# Patient Record
Sex: Female | Born: 1995 | Race: Black or African American | Hispanic: No | Smoking: Former smoker
Health system: Southern US, Community
[De-identification: ages and names within clinical notes are randomized; demographics above are authoritative.]

## PROBLEM LIST (undated history)

## (undated) DIAGNOSIS — F419 Anxiety disorder, unspecified: Secondary | ICD-10-CM

## (undated) DIAGNOSIS — J45909 Unspecified asthma, uncomplicated: Secondary | ICD-10-CM

## (undated) HISTORY — PX: NO PAST SURGERIES: SHX2092

---
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
## 2014-08-13 ENCOUNTER — Emergency Department (INDEPENDENT_AMBULATORY_CARE_PROVIDER_SITE_OTHER)
Admission: EM | Admit: 2014-08-13 | Discharge: 2014-08-13 | Disposition: A | Discharge status: Home / Self Care | Payer: Self-pay | Source: Home / Self Care | Attending: Family Medicine | Admitting: Family Medicine

## 2014-08-13 ENCOUNTER — Encounter (HOSPITAL_COMMUNITY): Payer: Self-pay | Admitting: Emergency Medicine

## 2014-08-13 DIAGNOSIS — N390 Urinary tract infection, site not specified: Secondary | ICD-10-CM

## 2014-08-13 HISTORY — DX: Anxiety disorder, unspecified: F41.9

## 2014-08-13 LAB — POCT URINALYSIS DIP (DEVICE)
Bilirubin Urine: NEGATIVE
Glucose, UA: NEGATIVE mg/dL
Hgb urine dipstick: NEGATIVE
Ketones, ur: NEGATIVE mg/dL
Nitrite: POSITIVE — AB
Protein, ur: NEGATIVE mg/dL
Specific Gravity, Urine: >=1.03 (ref 1.005–1.030)
Urobilinogen, UA: 1 mg/dL (ref 0.0–1.0)
pH: 6.5 (ref 5.0–8.0)

## 2014-08-13 LAB — POCT PREGNANCY, URINE: Preg Test, Ur: NEGATIVE

## 2014-08-13 MED ORDER — CIPROFLOXACIN HCL 500 MG PO TABS: 500.0000 mg | ORAL_TABLET | Freq: Two times a day (BID) | ORAL | Status: DC

## 2014-08-13 NOTE — ED Provider Notes (Signed)
Mary Green is a 18 y.o. female who presents to Urgent Care today for low back pain. Patient has a one-week history of mild intermittent bilateral low back pain. He does not radiate. No weakness or numbness bowel bladder dysfunction fevers or chills. No urinary for this he urgency or dysuria. No nausea vomiting or diarrhea. Patient additionally notes a very mild very intermittent chest pain history. The pain occurs for about one minute with smoking. No exertional component radiating pain palpitations or shortness of breath. No injury. No medications tried yet for either complaint.   Past Medical History  Diagnosis Date  . Anxiety    History  Substance Use Topics  . Smoking status: Current Every Day Smoker  . Smokeless tobacco: Not on file  . Alcohol Use: Yes   ROS as above Medications: No current facility-administered medications for this encounter.   Current Outpatient Prescriptions  Medication Sig Dispense Refill  . acetaminophen (TYLENOL) 325 MG tablet Take 650-975 mg by mouth daily as needed. For pain      . ciprofloxacin (CIPRO) 500 MG tablet Take 1 tablet (500 mg total) by mouth 2 (two) times daily.  14 tablet  0    Exam:  BP 127/80  Pulse 66  Temp(Src) 98.3 F (36.8 C) (Oral)  Resp 16  SpO2 99%  LMP 07/30/2014 Gen: Well NAD HEENT: EOMI,  MMM Lungs: Normal work of breathing. CTABL Heart: RRR no MRG Chest wall is nontender Abd: NABS, Soft. Nondistended, Nontender no CV angle tenderness to percussion Exts: Brisk capillary refill, warm and well perfused.  Back: Nontender. Normal back range of motion. Negative leg raise test and Faber test bilaterally. Reflexes and strength are equal bilaterally. Normal gait.  Results for orders placed during the hospital encounter of 08/13/14 (from the past 24 hour(s))  POCT URINALYSIS DIP (DEVICE)     Status: Abnormal   Collection Time    08/13/14  3:58 PM      Result Value Ref Range   Glucose, UA NEGATIVE  NEGATIVE mg/dL   Bilirubin Urine NEGATIVE  NEGATIVE   Ketones, ur NEGATIVE  NEGATIVE mg/dL   Specific Gravity, Urine >=1.030  1.005 - 1.030   Hgb urine dipstick NEGATIVE  NEGATIVE   pH 6.5  5.0 - 8.0   Protein, ur NEGATIVE  NEGATIVE mg/dL   Urobilinogen, UA 1.0  0.0 - 1.0 mg/dL   Nitrite POSITIVE (*) NEGATIVE   Leukocytes, UA SMALL (*) NEGATIVE  POCT PREGNANCY, URINE     Status: None   Collection Time    08/13/14  3:59 PM      Result Value Ref Range   Preg Test, Ur NEGATIVE  NEGATIVE   No results found.  Assessment and Plan: 18 y.o. female with urinary tract infection. Most likely explanation. Treat with Cipro as patient is allergic to penicillin. Culture pending. Advised patient to stop smoking. Followup with PCP.  Discussed warning signs or symptoms. Please see discharge instructions. Patient expresses understanding.     Rodolph BongEvan S Velna Hedgecock, MD 08/13/14 [phone]

## 2014-08-13 NOTE — ED Notes (Signed)
C/o lower back pain for one week.  Denies burning with urination. Bowel movement on arrival, no diarrhea Also c/o upper chest pain for 1 1/2 weeks.  Denies sob.  Denies uri

## 2014-08-13 NOTE — Discharge Instructions (Signed)
Thank you for coming in today. Call or go to the emergency room if you get worse, have trouble breathing, have chest pains, or palpitations.  Take Cipro twice daily for 1 week.  Use condoms while taking this medication.  Please stop smoking.  Come back as needed.   Urinary Tract Infection Urinary tract infections (UTIs) can develop anywhere along your urinary tract. Your urinary tract is your body's drainage system for removing wastes and extra water. Your urinary tract includes two kidneys, two ureters, a bladder, and a urethra. Your kidneys are a pair of bean-shaped organs. Each kidney is about the size of your fist. They are located below your ribs, one on each side of your spine. CAUSES Infections are caused by microbes, which are microscopic organisms, including fungi, viruses, and bacteria. These organisms are so small that they can only be seen through a microscope. Bacteria are the microbes that most commonly cause UTIs. SYMPTOMS  Symptoms of UTIs may vary by age and gender of the patient and by the location of the infection. Symptoms in young women typically include a frequent and intense urge to urinate and a painful, burning feeling in the bladder or urethra during urination. Older women and men are more likely to be tired, shaky, and weak and have muscle aches and abdominal pain. A fever may mean the infection is in your kidneys. Other symptoms of a kidney infection include pain in your back or sides below the ribs, nausea, and vomiting. DIAGNOSIS To diagnose a UTI, your caregiver will ask you about your symptoms. Your caregiver also will ask to provide a urine sample. The urine sample will be tested for bacteria and white blood cells. White blood cells are made by your body to help fight infection. TREATMENT  Typically, UTIs can be treated with medication. Because most UTIs are caused by a bacterial infection, they usually can be treated with the use of antibiotics. The choice of  antibiotic and length of treatment depend on your symptoms and the type of bacteria causing your infection. HOME CARE INSTRUCTIONS  If you were prescribed antibiotics, take them exactly as your caregiver instructs you. Finish the medication even if you feel better after you have only taken some of the medication.  Drink enough water and fluids to keep your urine clear or pale yellow.  Avoid caffeine, tea, and carbonated beverages. They tend to irritate your bladder.  Empty your bladder often. Avoid holding urine for long periods of time.  Empty your bladder before and after sexual intercourse.  After a bowel movement, women should cleanse from front to back. Use each tissue only once. SEEK MEDICAL CARE IF:   You have back pain.  You develop a fever.  Your symptoms do not begin to resolve within 3 days. SEEK IMMEDIATE MEDICAL CARE IF:   You have severe back pain or lower abdominal pain.  You develop chills.  You have nausea or vomiting.  You have continued burning or discomfort with urination. MAKE SURE YOU:   Understand these instructions.  Will watch your condition.  Will get help right away if you are not doing well or get worse. Document Released: 07/28/2005 Document Revised: 04/18/2012 Document Reviewed: 11/26/2011 [facility] CenterExitCare Patient Information 2015 [place], [place]. This information is not intended to replace advice given to you by your health care provider. Make sure you discuss any questions you have with your health care provider.

## 2014-08-16 LAB — URINE CULTURE
Colony Count: >=100000
Special Requests: NORMAL

## 2014-08-16 NOTE — ED Notes (Signed)
Urine culture: >100,000 colonies Klebsiella Pneumoniae.  Pt. adequately treated with Cipro. Vassie MoselleYork, Trayshawn Durkin M 08/16/2014

## 2014-08-20 ENCOUNTER — Emergency Department (HOSPITAL_COMMUNITY): Payer: Medicaid Other

## 2014-08-20 ENCOUNTER — Emergency Department (HOSPITAL_COMMUNITY)
Admission: EM | Admit: 2014-08-20 | Discharge: 2014-08-20 | Disposition: A | Discharge status: Home / Self Care | Payer: Medicaid Other | Attending: Emergency Medicine | Admitting: Emergency Medicine

## 2014-08-20 ENCOUNTER — Encounter (HOSPITAL_COMMUNITY): Payer: Self-pay | Admitting: Emergency Medicine

## 2014-08-20 DIAGNOSIS — Z87891 Personal history of nicotine dependence: Secondary | ICD-10-CM | POA: Insufficient documentation

## 2014-08-20 DIAGNOSIS — Z791 Long term (current) use of non-steroidal anti-inflammatories (NSAID): Secondary | ICD-10-CM | POA: Insufficient documentation

## 2014-08-20 DIAGNOSIS — R0789 Other chest pain: Secondary | ICD-10-CM

## 2014-08-20 DIAGNOSIS — R079 Chest pain, unspecified: Secondary | ICD-10-CM | POA: Diagnosis present

## 2014-08-20 DIAGNOSIS — Z8659 Personal history of other mental and behavioral disorders: Secondary | ICD-10-CM | POA: Diagnosis not present

## 2014-08-20 DIAGNOSIS — Z880 Allergy status to penicillin: Secondary | ICD-10-CM | POA: Insufficient documentation

## 2014-08-20 DIAGNOSIS — Z3202 Encounter for pregnancy test, result negative: Secondary | ICD-10-CM | POA: Insufficient documentation

## 2014-08-20 DIAGNOSIS — Z79899 Other long term (current) drug therapy: Secondary | ICD-10-CM | POA: Diagnosis not present

## 2014-08-20 LAB — BASIC METABOLIC PANEL
Anion gap: 13 (ref 5–15)
BUN: 10 mg/dL (ref 6–23)
CO2: 25 mEq/L (ref 19–32)
Calcium: 9.7 mg/dL (ref 8.4–10.5)
Chloride: 103 mEq/L (ref 96–112)
Creatinine, Ser: 0.93 mg/dL (ref 0.50–1.10)
GFR calc Af Amer: >90 mL/min (ref >90)
GFR calc non Af Amer: 89 mL/min — ABNORMAL LOW (ref >90)
Glucose, Bld: 96 mg/dL (ref 70–99)
Potassium: 3.6 mEq/L — ABNORMAL LOW (ref 3.7–5.3)
Sodium: 141 mEq/L (ref 137–147)

## 2014-08-20 LAB — CBC
HCT: 40.5 % (ref 36.0–46.0)
Hemoglobin: 13.7 g/dL (ref 12.0–15.0)
MCH: 29.5 pg (ref 26.0–34.0)
MCHC: 33.8 g/dL (ref 30.0–36.0)
MCV: 87.1 fL (ref 78.0–100.0)
Platelets: 326 10*3/uL (ref 150–400)
RBC: 4.65 MIL/uL (ref 3.87–5.11)
RDW: 12.3 % (ref 11.5–15.5)
WBC: 6.9 10*3/uL (ref 4.0–10.5)

## 2014-08-20 LAB — URINALYSIS, ROUTINE W REFLEX MICROSCOPIC
Bilirubin Urine: NEGATIVE
Glucose, UA: NEGATIVE mg/dL
Hgb urine dipstick: NEGATIVE
Ketones, ur: NEGATIVE mg/dL
Leukocytes, UA: NEGATIVE
Nitrite: NEGATIVE
Protein, ur: NEGATIVE mg/dL
Specific Gravity, Urine: 1.017 (ref 1.005–1.030)
Urobilinogen, UA: 0.2 mg/dL (ref 0.0–1.0)
pH: 6 (ref 5.0–8.0)

## 2014-08-20 LAB — I-STAT TROPONIN, ED: Troponin i, poc: 0.01 ng/mL (ref 0.00–0.08)

## 2014-08-20 LAB — POC URINE PREG, ED: Preg Test, Ur: NEGATIVE

## 2014-08-20 MED ORDER — NAPROXEN 250 MG PO TABS
500.0000 mg | ORAL_TABLET | Freq: Once | ORAL | Status: AC
  Administered 2014-08-20: 500 mg via ORAL
  Filled 2014-08-20: qty 2

## 2014-08-20 MED ORDER — NAPROXEN 500 MG PO TABS: 500.0000 mg | ORAL_TABLET | Freq: Two times a day (BID) | ORAL | Status: DC

## 2014-08-20 NOTE — ED Notes (Signed)
PA at bedside.

## 2014-08-20 NOTE — ED Provider Notes (Signed)
CSN: [id number]     Arrival date & time 08/20/14  0508 History   First MD Initiated Contact with Patient 08/20/14 0601     Chief Complaint  Patient presents with  . Chest Pain     (Consider location/radiation/quality/duration/timing/severity/associated sxs/prior Treatment) HPI Comments: Patient is a 18 year old female past medical history significant for anxiety presenting to the emergency department for 3 days of intermittent sharp pain to left side of her chest. 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 Aggravating factors: none. Medications tried prior to arrival: none. PERC negative.     Patient is a 18 y.o. female presenting with chest pain.  Chest Pain Associated symptoms: no abdominal pain, no cough, no fever, no nausea, no palpitations, no shortness of breath and not vomiting     Past Medical History  Diagnosis Date  . Anxiety    History reviewed. No pertinent past surgical history. No family history on file. History  Substance Use Topics  . Smoking status: Former Games developermoker  . Smokeless tobacco: Never Used  . Alcohol Use: Yes     Comment: weekly   OB History   Grav Para Term Preterm Abortions TAB SAB Ect Mult Living                 Review of Systems  Constitutional: Negative for fever and chills.  Respiratory: Negative for cough and shortness of breath.   Cardiovascular: Positive for chest pain. Negative for palpitations and leg swelling.  Gastrointestinal: Negative for nausea, vomiting and abdominal pain.  All other systems reviewed and are negative.     Allergies  Penicillins  Home Medications   Prior to Admission medications   Medication Sig Start Date End Date Taking? Authorizing Provider  acetaminophen (TYLENOL) 325 MG tablet Take 650-975 mg by mouth daily as needed for mild pain. For pain   Yes Historical Provider, MD  ciprofloxacin (CIPRO) 500 MG tablet Take 1 tablet (500 mg total)  by mouth 2 (two) times daily. 08/13/14  Yes Rodolph BongEvan S Corey, MD  naproxen (NAPROSYN) 500 MG tablet Take 1 tablet (500 mg total) by mouth 2 (two) times daily with a meal. 08/20/14   Kayela Humphres L Jalaine Riggenbach, PA-C   BP 108/61  Pulse 78  Temp(Src) 98 F (36.7 C) (Oral)  Resp 20  Ht 5\' 5"  (1.651 m)  Wt 131 lb (59.421 kg)  BMI 21.80 kg/m2  SpO2 100%  LMP 07/30/2014 Physical Exam  Nursing note and vitals reviewed. Constitutional: She is oriented to person, place, and time. She appears well-developed and well-nourished. No distress.  HENT:  Head: Normocephalic and atraumatic.  Right Ear: External ear normal.  Left Ear: External ear normal.  Nose: Nose normal.  Mouth/Throat: Oropharynx is clear and moist. No oropharyngeal exudate.  Eyes: Conjunctivae are normal.  Neck: Normal range of motion. Neck supple.  Cardiovascular: Normal rate, regular rhythm, normal heart sounds and intact distal pulses.   Pulmonary/Chest: Effort normal and breath sounds normal. No respiratory distress. She exhibits no tenderness.  Abdominal: Soft. Bowel sounds are normal. There is no tenderness.  Musculoskeletal: Normal range of motion. She exhibits no edema.  Neurological: She is alert and oriented to person, place, and time.  Skin: Skin is warm and dry. She is not diaphoretic.  Psychiatric: She has a normal mood and affect.    ED Course  Procedures (including critical care time) Medications  naproxen (NAPROSYN) tablet 500 mg (500 mg Oral Given  08/20/14 0609)    Labs Review Labs Reviewed  BASIC METABOLIC PANEL - Abnormal; Notable for the following:    Potassium 3.6 (*)    GFR calc non Af Amer 89 (*)    All other components within normal limits  CBC  URINALYSIS, ROUTINE W REFLEX MICROSCOPIC  I-STAT TROPOININ, ED  POC URINE PREG, ED    Imaging Review Dg Chest 2 View  08/20/2014   CLINICAL DATA:  LEFT-sided chest pain for a few days.  No Cough.  EXAM: CHEST  2 VIEW  COMPARISON:  None.  FINDINGS:  Cardiomediastinal silhouette is unremarkable. The lungs are clear without pleural effusions or focal consolidations. Trachea projects midline and there is no pneumothorax. Soft tissue planes and included osseous structures are non-suspicious.  IMPRESSION: No acute cardiopulmonary process.   Electronically Signed   By: Awilda Metroourtnay  Bloomer   On: 08/20/2014 06:34     EKG Interpretation   Date/Time:  Tuesday August 20 2014 05:19:12 EDT Ventricular Rate:  90 PR Interval:  159 QRS Duration: 76 QT Interval:  360 QTC Calculation: 440 R Axis:   60 Text Interpretation:  Sinus rhythm No significant change since last  tracing Confirmed by Erroll Lunani, Adeleke Ayokunle [phone]([id number]) on 08/20/2014 5:36:41  AM      MDM   Final diagnoses:  Chest wall pain    Filed Vitals:   08/20/14 0733  BP: 108/61  Pulse: 78  Temp:   Resp: 20   Afebrile, NAD, non-toxic appearing, AAOx4.   Patient is to be discharged with recommendation to follow up with PCP in regards to today's hospital visit. Chest pain is not likely of cardiac or pulmonary etiology d/t presentation, perc negative, VSS, no tracheal deviation, no JVD or new murmur, RRR, breath sounds equal bilaterally, EKG without acute abnormalities, negative troponin, and negative CXR. Pt has been advised to return to the ED is CP becomes exertional, associated with diaphoresis or nausea, radiates to left jaw/arm, worsens or becomes concerning in any way. Patient is stable at time of discharge  Case has been discussed with  Dr. Mora Bellmanni who agrees with the above plan to discharge.      Jeannetta EllisJennifer L Joel Mericle, PA-C 08/20/14 1608

## 2014-08-20 NOTE — ED Notes (Signed)
Pt arrives to ED c/o CP to the left side. 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 States that she has not taken anything for the pain.

## 2014-08-20 NOTE — ED Notes (Signed)
Took patient to the bathroom for urine sample. Pt forgot to urinate in the cup for collection. Asked her to please let us know as soon as she can go so we can get her tests going.

## 2014-08-20 NOTE — Discharge Instructions (Signed)
Please follow up with your primary care physician in 1-2 days. If you do not have one please call the [facility] number listed above. Please take medications as prescribed. Please read all discharge instructions and return precautions.    Chest Wall Pain Chest wall pain is pain in or around the bones and muscles of your chest. It may take up to 6 weeks to get better. It may take longer if you must stay physically active in your work and activities.  CAUSES  Chest wall pain may happen on its own. However, it may be caused by:  A viral illness like the flu.  Injury.  Coughing.  Exercise.  Arthritis.  Fibromyalgia.  Shingles. HOME CARE INSTRUCTIONS   Avoid overtiring physical activity. Try not to strain or perform activities that cause pain. This includes any activities using your chest or your abdominal and side muscles, especially if heavy weights are used.  Put ice on the sore area.  Put ice in a plastic bag.  Place a towel between your skin and the bag.  Leave the ice on for 15-20 minutes per hour while awake for the first 2 days.  Only take over-the-counter or prescription medicines for pain, discomfort, or fever as directed by your caregiver. SEEK IMMEDIATE MEDICAL CARE IF:   Your pain increases, or you are very uncomfortable.  You have a fever.  Your chest pain becomes worse.  You have new, unexplained symptoms.  You have nausea or vomiting.  You feel sweaty or lightheaded.  You have a cough with phlegm (sputum), or you cough up blood. MAKE SURE YOU:   Understand these instructions.  Will watch your condition.  Will get help right away if you are not doing well or get worse. Document Released: 10/18/2005 Document Revised: 01/10/2012 Document Reviewed: 06/14/2011 [facility] Patient Information 2015 [employer] [place]. This information is not intended to replace advice given to you by your health care provider. Make sure you discuss any  questions you have with your health care provider.

## 2014-08-21 NOTE — ED Provider Notes (Signed)
Medical screening examination/treatment/procedure(s) were performed by non-physician practitioner and as supervising physician I was immediately available for consultation/collaboration.   EKG Interpretation   Date/Time:  Tuesday August 20 2014 05:19:12 EDT Ventricular Rate:  90 PR Interval:  159 QRS Duration: 76 QT Interval:  360 QTC Calculation: 440 R Axis:   60 Text Interpretation:  Sinus rhythm No significant change since last  tracing Confirmed by Erroll Lunani, Chanse Kagel Ayokunle [phone]([id number]) on 08/20/2014 5:36:41  AM        Tomasita CrumbleAdeleke Dosha Broshears, MD 08/21/14 [phone]

## 2014-09-09 ENCOUNTER — Ambulatory Visit: Payer: Medicaid Other | Attending: Family Medicine | Admitting: Family Medicine

## 2014-09-09 ENCOUNTER — Other Ambulatory Visit (HOSPITAL_COMMUNITY)
Admission: RE | Admit: 2014-09-09 | Discharge: 2014-09-09 | Disposition: A | Discharge status: Home / Self Care | Payer: Medicaid Other | Source: Ambulatory Visit | Attending: Family Medicine | Admitting: Family Medicine

## 2014-09-09 ENCOUNTER — Encounter: Payer: Self-pay | Admitting: Family Medicine

## 2014-09-09 VITALS — BP 113/71 | HR 67 | Temp 98.2°F | Resp 16

## 2014-09-09 DIAGNOSIS — N912 Amenorrhea, unspecified: Secondary | ICD-10-CM | POA: Insufficient documentation

## 2014-09-09 DIAGNOSIS — F410 Panic disorder [episodic paroxysmal anxiety] without agoraphobia: Secondary | ICD-10-CM | POA: Insufficient documentation

## 2014-09-09 DIAGNOSIS — Z113 Encounter for screening for infections with a predominantly sexual mode of transmission: Secondary | ICD-10-CM | POA: Insufficient documentation

## 2014-09-09 DIAGNOSIS — N760 Acute vaginitis: Secondary | ICD-10-CM | POA: Diagnosis present

## 2014-09-09 DIAGNOSIS — N898 Other specified noninflammatory disorders of vagina: Secondary | ICD-10-CM

## 2014-09-09 DIAGNOSIS — L298 Other pruritus: Secondary | ICD-10-CM

## 2014-09-09 LAB — POCT URINE PREGNANCY: Preg Test, Ur: NEGATIVE

## 2014-09-09 MED ORDER — FLUCONAZOLE 150 MG PO TABS: 150.0000 mg | ORAL_TABLET | ORAL | Status: DC

## 2014-09-09 MED ORDER — FLUOXETINE HCL 20 MG PO TABS: 20.0000 mg | ORAL_TABLET | Freq: Every day | ORAL | Status: DC
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
## 2014-09-09 NOTE — Assessment & Plan Note (Signed)
Upreg negative.

## 2014-09-09 NOTE — Assessment & Plan Note (Signed)
A: exam consistent with yeast P: difucan 150 mg q 72 x 2 doses Counseled against douching F/u wet prep, GC and chlam

## 2014-09-09 NOTE — Patient Instructions (Addendum)
Ms. Mary Green,  Thank you for coming in today. It was a pleasure meeting you. I look forward to being your primary doctor.   1. Panic attacks: start prozac 20 mg daily in the morning. [facility].  2. Vaginal itching: It looks like yeast, take diflucan 150 mg today and again 3 days later.   U preg was negative.   F/u in 3- 4 weeks to f/u anxiety   Dr. Armen PickupFunches

## 2014-09-11 LAB — CERVICOVAGINAL ANCILLARY ONLY
Chlamydia: NEGATIVE
Neisseria Gonorrhea: NEGATIVE
Wet Prep (BD Affirm): NEGATIVE
Wet Prep (BD Affirm): NEGATIVE
Wet Prep (BD Affirm): POSITIVE — AB

## 2014-09-12 ENCOUNTER — Telehealth: Payer: Self-pay | Admitting: *Deleted

## 2014-09-12 NOTE — Telephone Encounter (Signed)
-----   Message from Lora PaulaJosalyn C Funches, MD sent at 09/12/2014  9:40 AM EST ----- Negative GC/chlam

## 2014-09-12 NOTE — Telephone Encounter (Signed)
Pt aware of lab result Advice to finish antibiotic as prescribe

## 2014-09-22 ENCOUNTER — Emergency Department (HOSPITAL_COMMUNITY)
Admission: EM | Admit: 2014-09-22 | Discharge: 2014-09-22 | Discharge status: Against Medical Advice | Payer: Medicaid Other | Attending: Emergency Medicine | Admitting: Emergency Medicine

## 2014-09-22 ENCOUNTER — Encounter (HOSPITAL_COMMUNITY): Payer: Self-pay | Admitting: Emergency Medicine

## 2014-09-22 DIAGNOSIS — M545 Low back pain: Secondary | ICD-10-CM | POA: Insufficient documentation

## 2014-09-22 DIAGNOSIS — Z3200 Encounter for pregnancy test, result unknown: Secondary | ICD-10-CM | POA: Diagnosis present

## 2014-09-22 DIAGNOSIS — R110 Nausea: Secondary | ICD-10-CM | POA: Diagnosis not present

## 2014-09-22 NOTE — ED Notes (Signed)
Once pt found out we would be doing a urine test, pt stated she done oone of those at home and wanted a blood test. Told pt about the process in ED and pt did not appear to like process. When pt went back out into lobby pt left and went down hall. Called for pt no pt in lobby.

## 2014-09-22 NOTE — ED Notes (Signed)
Pt states she had period for 2 days and that is not normal. Pt c/o lower back and abd pain. Pt states she has been nauseous and tired.

## 2014-09-28 ENCOUNTER — Emergency Department (HOSPITAL_COMMUNITY)
Admission: EM | Admit: 2014-09-28 | Discharge: 2014-09-28 | Disposition: A | Discharge status: Home / Self Care | Payer: Medicaid Other | Attending: Emergency Medicine | Admitting: Emergency Medicine

## 2014-09-28 ENCOUNTER — Encounter (HOSPITAL_COMMUNITY): Payer: Self-pay | Admitting: *Deleted

## 2014-09-28 DIAGNOSIS — R109 Unspecified abdominal pain: Secondary | ICD-10-CM | POA: Diagnosis present

## 2014-09-28 DIAGNOSIS — Z792 Long term (current) use of antibiotics: Secondary | ICD-10-CM | POA: Diagnosis not present

## 2014-09-28 DIAGNOSIS — F419 Anxiety disorder, unspecified: Secondary | ICD-10-CM | POA: Diagnosis not present

## 2014-09-28 DIAGNOSIS — Z880 Allergy status to penicillin: Secondary | ICD-10-CM | POA: Insufficient documentation

## 2014-09-28 DIAGNOSIS — Z87891 Personal history of nicotine dependence: Secondary | ICD-10-CM | POA: Insufficient documentation

## 2014-09-28 DIAGNOSIS — Z79899 Other long term (current) drug therapy: Secondary | ICD-10-CM | POA: Diagnosis not present

## 2014-09-28 LAB — URINALYSIS, ROUTINE W REFLEX MICROSCOPIC
Bilirubin Urine: NEGATIVE
Glucose, UA: NEGATIVE mg/dL
Hgb urine dipstick: NEGATIVE
Ketones, ur: NEGATIVE mg/dL
Leukocytes, UA: NEGATIVE
Nitrite: NEGATIVE
Protein, ur: NEGATIVE mg/dL
Specific Gravity, Urine: 1.022 (ref 1.005–1.030)
Urobilinogen, UA: 0.2 mg/dL (ref 0.0–1.0)
pH: 8 (ref 5.0–8.0)

## 2014-09-28 NOTE — ED Notes (Signed)
Pt sts "I have bladder infection, my boyfriend also has it his balls hurt. I lost my pill bottle my doctor gave me about a month ago and didn't finish taking them. My kidneys hurt again now so I know I have it again". Pt denies any urinary symptoms at this time.

## 2014-09-28 NOTE — ED Provider Notes (Signed)
CSN: [id number]     Arrival date & time 09/28/14  1145 History   First MD Initiated Contact with Patient 09/28/14 1236     Chief Complaint  Patient presents with  . Urinary Tract Infection      HPI Patient reports intermittent transient bilateral flank pain that last for a few seconds.  She is concerned that she could have urinary tract infection.  She's concerned because she did have a urinary tract infection and lost her antibiotics prior to completing them.  She also reports that her boyfriend is having testicular discomfort and this concerns her for possible UTI.  No fevers or chills.  Denies nausea vomiting.   Past Medical History  Diagnosis Date  . Anxiety 2009    previously on medications, seroquel, intuniv, risperdal, [facility]    Past Surgical History  Procedure Laterality Date  . Root canal  2008    Family History  Problem Relation Age of Onset  . Sarcoidosis Mother   . Carpal tunnel syndrome Mother   . Asthma Sister   . Heart murmur Sister   . Cancer Neg Hx    History  Substance Use Topics  . Smoking status: Former Smoker -- 0.25 packs/day for 2 years    Types: Cigarettes  . Smokeless tobacco: Never Used  . Alcohol Use: 0.6 oz/week    1 Shots of liquor per week     Comment: twice monthly    OB History    No data available     Review of Systems  All other systems reviewed and are negative.     Allergies  Penicillins  Home Medications   Prior to Admission medications   Medication Sig Start Date End Date Taking? Authorizing Provider  ciprofloxacin (CIPRO) 500 MG tablet Take 1 tablet (500 mg total) by mouth 2 (two) times daily. 08/13/14  Yes Rodolph BongEvan S Corey, MD  clindamycin (CLEOCIN) 150 MG capsule Take 150 mg by mouth every 8 (eight) hours. Take until all gone unknown days supply 09/05/14  Yes Historical Provider, MD  FLUoxetine (PROZAC) 20 MG tablet Take 1 tablet (20 mg total) by mouth daily. 09/09/14  Yes Josalyn C Funches, MD  ibuprofen  (ADVIL,MOTRIN) 200 MG tablet Take 400 mg by mouth every 6 (six) hours as needed for headache or moderate pain.   Yes Historical Provider, MD  fluconazole (DIFLUCAN) 150 MG tablet Take 1 tablet (150 mg total) by mouth every 3 (three) days. Patient not taking: Reported on 09/22/2014 09/09/14   Ellison CarwinJosalyn C Funches, MD   BP 138/78 mmHg  Pulse 85  Temp(Src) 98.3 F (36.8 C) (Oral)  Resp 14  SpO2 100%  LMP 09/12/2014 Physical Exam  Constitutional: She is oriented to person, place, and time. She appears well-developed and well-nourished. No distress.  HENT:  Head: Normocephalic and atraumatic.  Eyes: EOM are normal.  Neck: Normal range of motion.  Cardiovascular: Normal rate, regular rhythm and normal heart sounds.   Pulmonary/Chest: Effort normal and breath sounds normal.  Abdominal: Soft. She exhibits no distension. There is no tenderness.  Genitourinary:  No CVA tenderness  Musculoskeletal: Normal range of motion.  Neurological: She is alert and oriented to person, place, and time.  Skin: Skin is warm and dry.  Psychiatric: She has a normal mood and affect. Judgment normal.  Nursing note and vitals reviewed.   ED Course  Procedures (including critical care time) Labs Review Labs Reviewed  URINALYSIS, ROUTINE W REFLEX MICROSCOPIC  POC URINE PREG, ED  Imaging Review No results found.   EKG Interpretation None      MDM   Final diagnoses:  Flank pain    Well appearing.  Discharge home in good condition.  Urine and urine pregnancy test is negative.  No pain at this time.  Denies vaginal discharge or vaginal discomfort.    Lyanne CoKevin M Zeddie Njie, MD 09/28/14 [phone]

## 2014-10-21 ENCOUNTER — Encounter (HOSPITAL_COMMUNITY): Payer: Self-pay | Admitting: *Deleted

## 2014-10-21 ENCOUNTER — Emergency Department (HOSPITAL_COMMUNITY)
Admission: EM | Admit: 2014-10-21 | Discharge: 2014-10-21 | Disposition: A | Discharge status: Home / Self Care | Payer: Medicaid Other | Attending: Emergency Medicine | Admitting: Emergency Medicine

## 2014-10-21 DIAGNOSIS — Z87891 Personal history of nicotine dependence: Secondary | ICD-10-CM | POA: Diagnosis not present

## 2014-10-21 DIAGNOSIS — N390 Urinary tract infection, site not specified: Secondary | ICD-10-CM | POA: Diagnosis not present

## 2014-10-21 DIAGNOSIS — Z3200 Encounter for pregnancy test, result unknown: Secondary | ICD-10-CM | POA: Diagnosis present

## 2014-10-21 DIAGNOSIS — Z3202 Encounter for pregnancy test, result negative: Secondary | ICD-10-CM | POA: Insufficient documentation

## 2014-10-21 DIAGNOSIS — Z880 Allergy status to penicillin: Secondary | ICD-10-CM | POA: Insufficient documentation

## 2014-10-21 DIAGNOSIS — Z79899 Other long term (current) drug therapy: Secondary | ICD-10-CM | POA: Diagnosis not present

## 2014-10-21 DIAGNOSIS — F419 Anxiety disorder, unspecified: Secondary | ICD-10-CM | POA: Diagnosis not present

## 2014-10-21 LAB — URINALYSIS, ROUTINE W REFLEX MICROSCOPIC
Bilirubin Urine: NEGATIVE
Glucose, UA: NEGATIVE mg/dL
Hgb urine dipstick: NEGATIVE
Ketones, ur: NEGATIVE mg/dL
Nitrite: NEGATIVE
Protein, ur: NEGATIVE mg/dL
Specific Gravity, Urine: 1.027 (ref 1.005–1.030)
Urobilinogen, UA: 0.2 mg/dL (ref 0.0–1.0)
pH: 6.5 (ref 5.0–8.0)

## 2014-10-21 LAB — URINE MICROSCOPIC-ADD ON

## 2014-10-21 LAB — I-STAT BETA HCG BLOOD, ED (MC, WL, AP ONLY): I-stat hCG, quantitative: <5 m[IU]/mL (ref <5)

## 2014-10-21 LAB — PREGNANCY, URINE: Preg Test, Ur: NEGATIVE

## 2014-10-21 MED ORDER — CEPHALEXIN 500 MG PO CAPS: 500.0000 mg | ORAL_CAPSULE | Freq: Four times a day (QID) | ORAL | Status: DC

## 2014-10-21 NOTE — ED Notes (Addendum)
Pt reports she took pregnancy test 3 days ago, had positive result.pt brought pregnancy test with her.to rn home brought pregnancy test looked negative. Pt reported the positive line faded since the test was 3 days ago. Pt came to ED to get information and prenatal vitamins. No complaints at this time. Denies pain. Pt wants to know what she should eat.

## 2014-10-21 NOTE — Discharge Instructions (Signed)
Urinary Tract Infection Your pregnancy test is negative here. Follow up with your doctor. Return to the ED if you develop new or worsening symptoms. Urinary tract infections (UTIs) can develop anywhere along your urinary tract. Your urinary tract is your body's drainage system for removing wastes and extra water. Your urinary tract includes two kidneys, two ureters, a bladder, and a urethra. Your kidneys are a pair of bean-shaped organs. Each kidney is about the size of your fist. They are located below your ribs, one on each side of your spine. CAUSES Infections are caused by microbes, which are microscopic organisms, including fungi, viruses, and bacteria. These organisms are so small that they can only be seen through a microscope. Bacteria are the microbes that most commonly cause UTIs. SYMPTOMS  Symptoms of UTIs may vary by age and gender of the patient and by the location of the infection. Symptoms in young women typically include a frequent and intense urge to urinate and a painful, burning feeling in the bladder or urethra during urination. Older women and men are more likely to be tired, shaky, and weak and have muscle aches and abdominal pain. A fever may mean the infection is in your kidneys. Other symptoms of a kidney infection include pain in your back or sides below the ribs, nausea, and vomiting. DIAGNOSIS To diagnose a UTI, your caregiver will ask you about your symptoms. Your caregiver also will ask to provide a urine sample. The urine sample will be tested for bacteria and white blood cells. White blood cells are made by your body to help fight infection. TREATMENT  Typically, UTIs can be treated with medication. Because most UTIs are caused by a bacterial infection, they usually can be treated with the use of antibiotics. The choice of antibiotic and length of treatment depend on your symptoms and the type of bacteria causing your infection. HOME CARE INSTRUCTIONS  If you were  prescribed antibiotics, take them exactly as your caregiver instructs you. Finish the medication even if you feel better after you have only taken some of the medication.  Drink enough water and fluids to keep your urine clear or pale yellow.  Avoid caffeine, tea, and carbonated beverages. They tend to irritate your bladder.  Empty your bladder often. Avoid holding urine for long periods of time.  Empty your bladder before and after sexual intercourse.  After a bowel movement, women should cleanse from front to back. Use each tissue only once. SEEK MEDICAL CARE IF:   You have back pain.  You develop a fever.  Your symptoms do not begin to resolve within 3 days. SEEK IMMEDIATE MEDICAL CARE IF:   You have severe back pain or lower abdominal pain.  You develop chills.  You have nausea or vomiting.  You have continued burning or discomfort with urination. MAKE SURE YOU:   Understand these instructions.  Will watch your condition.  Will get help right away if you are not doing well or get worse. Document Released: 07/28/2005 Document Revised: 04/18/2012 Document Reviewed: 11/26/2011 [facility] Patient Information 2015 [employer] [employer] [place]. This information is not intended to replace advice given to you by your health care provider. Make sure you discuss any questions you have with your health care provider.

## 2014-10-21 NOTE — ED Provider Notes (Signed)
Understand the rationale CSN: [id number]     Arrival date & time 10/21/14  [id number] History   First MD Initiated Contact with Patient 10/21/14 [phone]     Chief Complaint  Patient presents with  . pregnancy information      (Consider location/radiation/quality/duration/timing/severity/associated sxs/prior Treatment) HPI Comments: Patient states she had a positive home pregnancy test 3 days ago and is concerned that she needs prenatal vitamins. She states her last menstrual cycle was in November. She denies any abdominal pain or vaginal bleeding. She denies any fevers or vomiting. She's had nausea and the mornings for the past month. Good by mouth intake and urine output. She's had whitish vaginal discharge for the past week but does not want that evaluated.  The history is provided by the patient.    Past Medical History  Diagnosis Date  . Anxiety 2009    previously on medications, seroquel, intuniv, risperdal, [facility]    Past Surgical History  Procedure Laterality Date  . Root canal  2008    Family History  Problem Relation Age of Onset  . Sarcoidosis Mother   . Carpal tunnel syndrome Mother   . Asthma Sister   . Heart murmur Sister   . Cancer Neg Hx    History  Substance Use Topics  . Smoking status: Former Smoker -- 0.25 packs/day for 2 years    Types: Cigarettes  . Smokeless tobacco: Never Used  . Alcohol Use: 0.6 oz/week    1 Shots of liquor per week     Comment: twice monthly    OB History    Gravida Para Term Preterm AB TAB SAB Ectopic Multiple Living   1              Review of Systems  Constitutional: Negative for fever, activity change and appetite change.  Respiratory: Negative for cough, chest tightness and shortness of breath.   Gastrointestinal: Positive for nausea. Negative for vomiting and abdominal pain.  Genitourinary: Negative for dysuria, hematuria, vaginal bleeding and vaginal discharge.  Musculoskeletal: Negative for myalgias, back pain and  arthralgias.  Skin: Negative for rash.  Neurological: Negative for dizziness, weakness and headaches.  A complete 10 system review of systems was obtained and all systems are negative except as noted in the HPI and PMH.      Allergies  Penicillins  Home Medications   Prior to Admission medications   Medication Sig Start Date End Date Taking? Authorizing Provider  FLUoxetine (PROZAC) 20 MG tablet Take 1 tablet (20 mg total) by mouth daily. 09/09/14  Yes Josalyn C Funches, MD  cephALEXin (KEFLEX) 500 MG capsule Take 1 capsule (500 mg total) by mouth 4 (four) times daily. 10/21/14   Glynn OctaveStephen Pau Banh, MD  clindamycin (CLEOCIN) 150 MG capsule Take 150 mg by mouth every 8 (eight) hours. Take until all gone unknown days supply 09/05/14   Historical Provider, MD  fluconazole (DIFLUCAN) 150 MG tablet Take 1 tablet (150 mg total) by mouth every 3 (three) days. Patient not taking: Reported on 09/22/2014 09/09/14   Lora PaulaJosalyn C Funches, MD  ibuprofen (ADVIL,MOTRIN) 200 MG tablet Take 400 mg by mouth every 6 (six) hours as needed for headache or moderate pain.    Historical Provider, MD   BP 147/93 mmHg  Pulse 94  Temp(Src) 98.3 F (36.8 C) (Oral)  Resp 16  SpO2 100%  LMP 09/12/2014 Physical Exam  Constitutional: She is oriented to person, place, and time. She appears well-developed and well-nourished. No distress.  HENT:  Head: Normocephalic and atraumatic.  Mouth/Throat: Oropharynx is clear and moist. No oropharyngeal exudate.  Eyes: Conjunctivae and EOM are normal. Pupils are equal, round, and reactive to light.  Neck: Normal range of motion. Neck supple.  No meningismus.  Cardiovascular: Normal rate, regular rhythm, normal heart sounds and intact distal pulses.   No murmur heard. Pulmonary/Chest: Effort normal and breath sounds normal. No respiratory distress.  Abdominal: Soft. There is no tenderness. There is no rebound and no guarding.  Musculoskeletal: Normal range of motion. She exhibits  no edema or tenderness.  No CVAT  Neurological: She is alert and oriented to person, place, and time. No cranial nerve deficit. She exhibits normal muscle tone. Coordination normal.  No ataxia on finger to nose bilaterally. No pronator drift. 5/5 strength throughout. CN 2-12 intact. Negative Romberg. Equal grip strength. Sensation intact. Gait is normal.   Skin: Skin is warm.  Psychiatric: She has a normal mood and affect. Her behavior is normal.  Nursing note and vitals reviewed.   ED Course  Procedures (including critical care time) Labs Review Labs Reviewed  URINALYSIS, ROUTINE W REFLEX MICROSCOPIC - Abnormal; Notable for the following:    APPearance CLOUDY (*)    Leukocytes, UA SMALL (*)    All other components within normal limits  URINE MICROSCOPIC-ADD ON - Abnormal; Notable for the following:    Squamous Epithelial / LPF FEW (*)    Bacteria, UA FEW (*)    All other components within normal limits  PREGNANCY, URINE  I-STAT BETA HCG BLOOD, ED ([facility], [facility], [facility] ONLY)    Imaging Review No results found.   EKG Interpretation None      MDM   Final diagnoses:  Urinary tract infection without hematuria, site unspecified   Patient concerned for pregnancy. Last menstrual cycle last month. No abdominal pain or vaginal bleeding  HCG negative. Urinalysis shows leukocytes and white blood cells.  Patient declines pelvic exam today.  Patient upset when told that her pregnancy test is negative. She states she's had multiple tests at home and doesn't understand why she is not pregnant here. She is insisting she needs a pregnancy blood test.  Serum hCG is negative. Patient reassured she is not pregnant. Heart rate improved to 69. She denies any suicidal or homicidal thoughts. She denies any hallucinations. She still has difficulty accepting her negative pregnancy test results.  PCP resources given.  Blood pressure 147/93, pulse 94, temperature 98.3 F (36.8 C), temperature source  Oral, resp. rate 16, last menstrual period 09/12/2014, SpO2 100 %.     Glynn OctaveStephen Norvell Ureste, MD 10/21/14 [phone]

## 2014-11-11 ENCOUNTER — Encounter (HOSPITAL_COMMUNITY): Payer: Self-pay

## 2014-11-11 ENCOUNTER — Inpatient Hospital Stay (HOSPITAL_COMMUNITY)
Admission: AD | Admit: 2014-11-11 | Discharge: 2014-11-11 | Disposition: A | Discharge status: Home / Self Care | Payer: Medicaid Other | Source: Ambulatory Visit | Attending: Family Medicine | Admitting: Family Medicine

## 2014-11-11 DIAGNOSIS — Z3202 Encounter for pregnancy test, result negative: Secondary | ICD-10-CM | POA: Diagnosis not present

## 2014-11-11 DIAGNOSIS — Z3200 Encounter for pregnancy test, result unknown: Secondary | ICD-10-CM | POA: Diagnosis present

## 2014-11-11 LAB — POCT PREGNANCY, URINE: Preg Test, Ur: NEGATIVE

## 2014-11-11 LAB — HCG, QUANTITATIVE, PREGNANCY: hCG, Beta Chain, Quant, S: <1 m[IU]/mL (ref <5)

## 2014-11-11 NOTE — MAU Provider Note (Signed)
Subjective:  Ms AVY BARLETT is a 19 y.o. female G1P0 who presents to MAU for a pregnancy test. When pregnancy test results were given the patient demanded a blood test due to movement she feels in her abdomen.  Beta hcg level ordered.   RN note:   Expand All Collapse All   Pt states had unprotected sex, then didn't have cycle. Has had positive upt's at home. Line is faint however is there. Denies bleeding or abnormal vaginal discharge.      Objective:  GENERAL: Well-developed, well-nourished female, anxious, angry  HEENT: Normocephalic, atraumatic.   LUNGS: Effort normal SKIN: Warm, dry and without erythema PSYCH: Normal mood and affect  Filed Vitals:   11/11/14 1234  BP: 127/68  Pulse: 69  Temp: 98.6 F (37 C)  Resp: 16   Results for orders placed or performed during the hospital encounter of 11/11/14 (from the past 48 hour(s))  Pregnancy, urine POC     Status: None   Collection Time: 11/11/14 12:37 PM  Result Value Ref Range   Preg Test, Ur NEGATIVE NEGATIVE    Comment:        THE SENSITIVITY OF THIS METHODOLOGY IS >24 mIU/mL   hCG, quantitative, pregnancy     Status: None   Collection Time: 11/11/14 12:47 PM  Result Value Ref Range   hCG, Beta Chain, Quant, S <1 <5 mIU/mL    Comment:          GEST. AGE      CONC.  (mIU/mL)   <=1 WEEK        5 - 50     2 WEEKS       50 - 500     3 WEEKS       100 - 10,000     4 WEEKS     1,000 - 30,000     5 WEEKS     3,500 - 115,000   6-8 WEEKS     12,000 - 270,000    12 WEEKS     15,000 - 220,000        FEMALE AND NON-PREGNANT FEMALE:     LESS THAN 5 mIU/mL REPEATED TO VERIFY     MDM  -Urine pregnancy test negative -Beta hcg level negative  -Patient and significant other brought back to room 8. I received permission to discuss lab results with patient with significant other present; patient was ok with me discussing. I informed the patient that her Hcg level was negative and she is not pregnant at this time. She then  stated that I needed to order and [place] because that is her right as a patient to have an US done today to determine why she was feeling movement in her abdomen. I told her that I did not have a reason to do an [place] in the emergency room at this time. I told her that I would be happy to assess her when a bed in the MAU became available and if I felt and [place] was necessary that I would order her to have one done out patient. The patient became very loud stating that it was her right as a patient to receive medical care. I again stated that I would be happy to see her and assess her when a bed was available. The patient began using foul language while walking out of MAU.   Assessment:  Negative urine pregnancy test Negative Hcg blood test    Plan:  Discharge home in  stable condition At home pregnancy test encouraged Follow up with PCP    Debbrah AlarJennifer Irene Mariaclara Spear, NP 11/11/2014 3:07 PM

## 2014-11-11 NOTE — MAU Note (Signed)
Pt states had unprotected sex, then didn't have cycle. Has had positive upt's at home. Line is faint however is there. Denies bleeding or abnormal vaginal discharge.

## 2015-01-06 ENCOUNTER — Emergency Department: Payer: Self-pay | Admitting: Emergency Medicine

## 2018-01-24 ENCOUNTER — Encounter: Payer: Self-pay | Admitting: *Deleted

## 2018-01-26 ENCOUNTER — Telehealth: Payer: Self-pay | Admitting: *Deleted

## 2018-01-26 NOTE — Telephone Encounter (Signed)
Reviewed records for chart prep for new ob visit next week. No ob records received. Called patient and asked her to either call her ob office in [place] to fax orders or if necessary come by our office as soon as possible to sign release for [place] to get her ob records. She states she will try.

## 2018-01-31 ENCOUNTER — Encounter: Payer: Self-pay | Admitting: Obstetrics and Gynecology

## 2018-01-31 ENCOUNTER — Ambulatory Visit (INDEPENDENT_AMBULATORY_CARE_PROVIDER_SITE_OTHER): Payer: Medicaid Other | Admitting: Obstetrics and Gynecology

## 2018-01-31 DIAGNOSIS — Z3402 Encounter for supervision of normal first pregnancy, second trimester: Secondary | ICD-10-CM

## 2018-01-31 DIAGNOSIS — Z3400 Encounter for supervision of normal first pregnancy, unspecified trimester: Secondary | ICD-10-CM | POA: Insufficient documentation

## 2018-01-31 MED ORDER — METOCLOPRAMIDE HCL 10 MG PO TABS: 10.0000 mg | ORAL_TABLET | Freq: Four times a day (QID) | ORAL | 1 refills | Status: DC | PRN

## 2018-01-31 NOTE — Progress Notes (Signed)
New OB Note  01/31/2018   CC:  Chief Complaint  Patient presents with  . Initial Prenatal Visit    Transfer of Care Patient: yes; moved from [place]  History of Present Illness: Mary Green is a 22 y.o. G1P0000 at [redacted]w[redacted]d by early ultrasound (in [place] per patient) being seen today for her first obstetrical visit. Her obstetrical history is significant for THC use in early pregnancy. This was a unplanned pregnancy. Patient does intend to breast feed. Patient plans to do contraception after completion of pregnancy but unsure of what type. Pregnancy history fully reviewed.  Her periods were: regular  She was using no method when she conceived.  She has Positive signs or symptoms of nausea/vomiting of pregnancy. She has Negative signs or symptoms of miscarriage or preterm labor  Patient reports no complaints.  ROS: A 12-point review of systems was performed and negative, except as stated in the above HPI.   OBGYN History: OB History  Gravida Para Term Preterm AB Living  1 0 0 0 0 0  SAB TAB Ectopic Multiple Live Births  0 0 0 0 0    # Outcome Date GA Lbr Len/2nd Weight Sex Delivery Anes PTL Lv  1 Current             GYN Hx: Unknown  Past Medical History: Past Medical History:  Diagnosis Date  . Anxiety 2009   previously on medications, seroquel, intuniv, risperdal, treated at [facility]     Past Surgical History: Past Surgical History:  Procedure Laterality Date  . NO PAST SURGERIES    . ROOT CANAL  2008     Family History:  Family History  Problem Relation Age of Onset  . Sarcoidosis Mother   . Carpal tunnel syndrome Mother   . Asthma Sister   . Heart murmur Sister   . Cancer Neg Hx     Social History:  Social History   Socioeconomic History  . Marital status: Single    Spouse name: Not on file  . Number of children: 0  . Years of education: 38   . Highest education level: Not on file  Occupational History  . Occupation: Consulting civil engineer    Comment: Part time    . Occupation: Occupational hygienist     Comment: Designer, television/film set   Social Needs  . Financial resource strain: Not on file  . Food insecurity:    Worry: Not on file    Inability: Not on file  . Transportation needs:    Medical: Not on file    Non-medical: Not on file  Tobacco Use  . Smoking status: Former Smoker    Packs/day: 0.25    Years: 2.00    Pack years: 0.50    Types: Cigarettes  . Smokeless tobacco: Never Used  Substance and Sexual Activity  . Alcohol use: Not Currently    Alcohol/week: 0.6 oz    Types: 1 Shots of liquor per week    Comment: twice monthly   . Drug use: No  . Sexual activity: Not Currently    Birth control/protection: Condom, None  Lifestyle  . Physical activity:    Days per week: Not on file    Minutes per session: Not on file  . Stress: Not on file  Relationships  . Social connections:    Talks on phone: Not on file    Gets together: Not on file    Attends religious service: Not on file  Active member of club or organization: Not on file    Attends meetings of clubs or organizations: Not on file    Relationship status: Not on file  . Intimate partner violence:    Fear of current or ex partner: Not on file    Emotionally abused: Not on file    Physically abused: Not on file    Forced sexual activity: Not on file  Other Topics Concern  . Not on file  Social History Narrative   Lives alone.    Mom lives in [place].     Allergy: Allergies  Allergen Reactions  . Penicillins Other (See Comments)    Childhood reaction    Current Outpatient Medications:  Current Outpatient Medications:  .  cephALEXin (KEFLEX) 500 MG capsule, Take 1 capsule (500 mg total) by mouth 4 (four) times daily., Disp: 40 capsule, Rfl: 0 .  clindamycin (CLEOCIN) 150 MG capsule, Take 150 mg by mouth every 8 (eight) hours. Take until all gone unknown days supply, Disp: , Rfl:  .  fluconazole (DIFLUCAN) 150 MG tablet, Take 1 tablet (150 mg total)  by mouth every 3 (three) days. (Patient not taking: Reported on 09/22/2014), Disp: 2 tablet, Rfl: 0 .  FLUoxetine (PROZAC) 20 MG tablet, Take 1 tablet (20 mg total) by mouth daily., Disp: 30 tablet, Rfl: 1 .  ibuprofen (ADVIL,MOTRIN) 200 MG tablet, Take 400 mg by mouth every 6 (six) hours as needed for headache or moderate pain., Disp: , Rfl:   Physical Exam:   BP 123/65   Pulse 92   Wt 64.8 kg (142 lb 12.8 oz)   LMP 09/12/2017   BMI 23.76 kg/m  Body mass index is 23.76 kg/m. Contractions: Not present Vag. Bleeding: None. Fundal height: 21 FHTs: 158  General appearance: Well nourished, well developed female in no acute distress.  Neck:  Supple, normal appearance, and no thyromegaly  Cardiovascular: regular rate and rhythm Respiratory: Normal respiratory effort Abdomen: No masses, hernias; diffusely non tender to palpation, non distended Breasts: not examined. Genitalia: deferred Neuro/Psych:  Normal mood and affect.  Skin:  Warm and dry.  Lymphatic:  No inguinal lymphadenopathy.    Assessment/Plan: G1P0000 3762w1d  1. Supervision of normal first pregnancy, antepartum Doing well. Records from piror office requested. Will hold off on initial labs until records reviewed. Rx for Reglan given due to n/v.   Continue prenatal vitamins. Ultrasound discussed; fetal anatomic survey: ordered. Problem list reviewed and updated. The nature of [name] with multiple MDs and other Advanced Practice Providers was explained to patient; also emphasized that residents, students are part of our team. DOES NOT WANT STUDENTS Routine obstetric precautions reviewed. Return in about 1 month (around 02/28/2018) for ob visit.    Mary AdaJazma Shalynn Jorstad, DO OB Fellow [facility]

## 2018-01-31 NOTE — Patient Instructions (Signed)
Second Trimester of Pregnancy The second trimester is from week 13 through week 28, month 4 through 6. This is often the time in pregnancy that you feel your best. Often times, morning sickness has lessened or quit. You may have more energy, and you may get hungry more often. Your unborn baby (fetus) is growing rapidly. At the end of the sixth month, he or she is about 9 inches long and weighs about 1 pounds. You will likely feel the baby move (quickening) between 18 and 20 weeks of pregnancy. Follow these instructions at home:  Avoid all smoking, herbs, and alcohol. Avoid drugs not approved by your doctor.  Do not use any tobacco products, including cigarettes, chewing tobacco, and electronic cigarettes. If you need help quitting, ask your doctor. You may get counseling or other support to help you quit.  Only take medicine as told by your doctor. Some medicines are safe and some are not during pregnancy.  Exercise only as told by your doctor. Stop exercising if you start having cramps.  Eat regular, healthy meals.  Wear a good support bra if your breasts are tender.  Do not use hot tubs, steam rooms, or saunas.  Wear your seat belt when driving.  Avoid raw meat, uncooked cheese, and liter boxes and soil used by cats.  Take your prenatal vitamins.  Take 1500-2000 milligrams of calcium daily starting at the 20th week of pregnancy until you deliver your baby.  Try taking medicine that helps you poop (stool softener) as needed, and if your doctor approves. Eat more fiber by eating fresh fruit, vegetables, and whole grains. Drink enough fluids to keep your pee (urine) clear or pale yellow.  Take warm water baths (sitz baths) to soothe pain or discomfort caused by hemorrhoids. Use hemorrhoid cream if your doctor approves.  If you have puffy, bulging veins (varicose veins), wear support hose. Raise (elevate) your feet for 15 minutes, 3-4 times a day. Limit salt in your diet.  Avoid heavy  lifting, wear low heals, and sit up straight.  Rest with your legs raised if you have leg cramps or low back pain.  Visit your dentist if you have not gone during your pregnancy. Use a soft toothbrush to brush your teeth. Be gentle when you floss.  You can have sex (intercourse) unless your doctor tells you not to.  Go to your doctor visits. Get help if:  You feel dizzy.  You have mild cramps or pressure in your lower belly (abdomen).  You have a nagging pain in your belly area.  You continue to feel sick to your stomach (nauseous), throw up (vomit), or have watery poop (diarrhea).  You have bad smelling fluid coming from your vagina.  You have pain with peeing (urination). Get help right away if:  You have a fever.  You are leaking fluid from your vagina.  You have spotting or bleeding from your vagina.  You have severe belly cramping or pain.  You lose or gain weight rapidly.  You have trouble catching your breath and have chest pain.  You notice sudden or extreme puffiness (swelling) of your face, hands, ankles, feet, or legs.  You have not felt the baby move in over an hour.  You have severe headaches that do not go away with medicine.  You have vision changes. This information is not intended to replace advice given to you by your health care provider. Make sure you discuss any questions you have with your health care   provider. Document Released: 01/12/2010 Document Revised: 03/25/2016 Document Reviewed: 12/19/2012 Elsevier Interactive Patient Education  2017 [employer]vier Inc.  

## 2018-02-03 ENCOUNTER — Encounter (HOSPITAL_COMMUNITY): Payer: Self-pay | Admitting: *Deleted

## 2018-02-09 ENCOUNTER — Ambulatory Visit (HOSPITAL_COMMUNITY)
Admission: RE | Admit: 2018-02-09 | Discharge: 2018-02-09 | Disposition: A | Discharge status: Home / Self Care | Payer: Medicaid Other | Source: Ambulatory Visit | Attending: Obstetrics and Gynecology | Admitting: Obstetrics and Gynecology

## 2018-02-09 ENCOUNTER — Other Ambulatory Visit: Payer: Self-pay | Admitting: Obstetrics and Gynecology

## 2018-02-09 DIAGNOSIS — Z3A21 21 weeks gestation of pregnancy: Secondary | ICD-10-CM | POA: Insufficient documentation

## 2018-02-09 DIAGNOSIS — Z3689 Encounter for other specified antenatal screening: Secondary | ICD-10-CM

## 2018-02-09 DIAGNOSIS — Z3400 Encounter for supervision of normal first pregnancy, unspecified trimester: Secondary | ICD-10-CM

## 2018-02-10 ENCOUNTER — Encounter: Payer: Self-pay | Admitting: Family Medicine

## 2018-02-10 DIAGNOSIS — Z3400 Encounter for supervision of normal first pregnancy, unspecified trimester: Secondary | ICD-10-CM

## 2018-02-10 NOTE — Progress Notes (Signed)
Chart reviewed.

## 2018-02-28 ENCOUNTER — Encounter: Payer: Self-pay | Admitting: Advanced Practice Midwife

## 2018-03-24 ENCOUNTER — Other Ambulatory Visit: Payer: Self-pay | Admitting: *Deleted

## 2018-03-24 DIAGNOSIS — Z3400 Encounter for supervision of normal first pregnancy, unspecified trimester: Secondary | ICD-10-CM

## 2018-03-28 ENCOUNTER — Other Ambulatory Visit: Payer: Self-pay

## 2018-03-28 ENCOUNTER — Encounter: Payer: Self-pay | Admitting: Obstetrics and Gynecology

## 2018-09-11 ENCOUNTER — Encounter (HOSPITAL_COMMUNITY): Payer: Self-pay

## 2019-12-21 ENCOUNTER — Encounter (HOSPITAL_COMMUNITY): Payer: Self-pay

## 2019-12-21 ENCOUNTER — Emergency Department (HOSPITAL_COMMUNITY): Payer: Medicaid Other

## 2019-12-21 ENCOUNTER — Other Ambulatory Visit: Payer: Self-pay

## 2019-12-21 ENCOUNTER — Emergency Department (HOSPITAL_COMMUNITY)
Admission: EM | Admit: 2019-12-21 | Discharge: 2019-12-21 | Disposition: A | Discharge status: Home / Self Care | Payer: Medicaid Other | Attending: Emergency Medicine | Admitting: Emergency Medicine

## 2019-12-21 DIAGNOSIS — Z87891 Personal history of nicotine dependence: Secondary | ICD-10-CM | POA: Insufficient documentation

## 2019-12-21 DIAGNOSIS — R519 Headache, unspecified: Secondary | ICD-10-CM | POA: Diagnosis not present

## 2019-12-21 LAB — I-STAT BETA HCG BLOOD, ED (MC, WL, AP ONLY): I-stat hCG, quantitative: <5 m[IU]/mL (ref <5)

## 2019-12-21 MED ORDER — PROCHLORPERAZINE EDISYLATE 10 MG/2ML IJ SOLN
10.0000 mg | Freq: Once | INTRAMUSCULAR | Status: AC
  Administered 2019-12-21: 12:00:00 10 mg via INTRAVENOUS
  Filled 2019-12-21: qty 2

## 2019-12-21 MED ORDER — DIPHENHYDRAMINE HCL 50 MG/ML IJ SOLN
12.5000 mg | Freq: Once | INTRAMUSCULAR | Status: AC
  Administered 2019-12-21: 12.5 mg via INTRAVENOUS
  Filled 2019-12-21: qty 1

## 2019-12-21 NOTE — Discharge Instructions (Signed)
You were seen in the emergency department today for a headache.  Your CT scan did not show any significant abnormalities.  Your pregnancy test was negative.  You were given a migraine cocktail with improvement in the ER.  Please take Motrin and/or Tylenol per over-the-counter dosing to help with any return of discomfort.  Please follow-up with your primary care provider within 3 days.  You do not have a primary care provider you can follow-up with the [place] [facility] or call the phone number in your discharge instructions.  Return to the ER for new or worsening symptoms including but not limited to worsening pain, inability to keep fluids down, change in your vision, numbness, weakness, dizziness, passing out, or any other concerns.

## 2019-12-21 NOTE — ED Triage Notes (Signed)
Patient c/o headache x 3 days. Patient denies any blurred vision and nausea.

## 2019-12-21 NOTE — ED Provider Notes (Signed)
[place] DEPT Provider Note   CSN: [id number] Arrival date & time: 12/21/19  1046     History Chief Complaint  Patient presents with  . Headache    Mary Green is a 24 y.o. female with a history of anxiety, panic attacks, & former tobacco use who presents to the ED with complaints of headache x 3 days. Patient states she has had intermittent headache to variable locations ( frontal, parietal, occipital) which has had gradual onset with each episode of pain.  Her current discomfort is a 5 out of 10 in severity.  She has tried Tylenol without much relief.  No specific alleviating or aggravating factors.  States that she has not had issues with headaches in the past or had similar headaches.  Denies visual disturbance, dizziness, numbness, weakness, neck stiffness, fever, chills, nausea, or vomiting.  She reports occasional alcohol use.  Denies drug use.  HPI     Past Medical History:  Diagnosis Date  . Anxiety 2009   previously on medications, seroquel, intuniv, risperdal, treated at [facility]     Patient Active Problem List   Diagnosis Date Noted  . Supervision of normal first pregnancy, antepartum 01/31/2018  . Amenorrhea 09/09/2014  . Panic attacks 09/09/2014  . Vaginal itching 09/09/2014    Past Surgical History:  Procedure Laterality Date  . NO PAST SURGERIES    . ROOT CANAL  2008      OB History    Gravida  1   Para  0   Term  0   Preterm  0   AB  0   Living  0     SAB  0   TAB  0   Ectopic  0   Multiple  0   Live Births  0           Family History  Problem Relation Age of Onset  . Sarcoidosis Mother   . Carpal tunnel syndrome Mother   . Asthma Sister   . Heart murmur Sister   . Cancer Neg Hx     Social History   Tobacco Use  . Smoking status: Former Smoker    Packs/day: 0.25    Years: 2.00    Pack years: 0.50    Types: Cigarettes  . Smokeless tobacco: Never Used  Substance Use Topics  .  Alcohol use: Yes    Alcohol/week: 1.0 standard drinks    Types: 1 Shots of liquor per week    Comment: twice monthly   . Drug use: No    Home Medications Prior to Admission medications   Medication Sig Start Date End Date Taking? Authorizing Provider  ibuprofen (ADVIL,MOTRIN) 200 MG tablet Take 400 mg by mouth every 6 (six) hours as needed for headache or moderate pain.   Yes [provider]  cephALEXin (KEFLEX) 500 MG capsule Take 1 capsule (500 mg total) by mouth 4 (four) times daily. Patient not taking: Reported on 12/21/2019 10/21/14   Ezequiel Essex, MD  fluconazole (DIFLUCAN) 150 MG tablet Take 1 tablet (150 mg total) by mouth every 3 (three) days. Patient not taking: Reported on 09/22/2014 09/09/14   Boykin Nearing, MD  FLUoxetine (PROZAC) 20 MG tablet Take 1 tablet (20 mg total) by mouth daily. Patient not taking: Reported on 12/21/2019 09/09/14   Boykin Nearing, MD  metoCLOPramide (REGLAN) 10 MG tablet Take 1 tablet (10 mg total) by mouth every 6 (six) hours as needed for nausea or vomiting. Patient not taking:  Reported on 12/21/2019 01/31/18   Pincus Large, DO    Allergies    Penicillins  Review of Systems   Review of Systems  Constitutional: Negative for chills and fever.  Eyes: Negative for visual disturbance.  Respiratory: Negative for shortness of breath.   Cardiovascular: Negative for chest pain.  Gastrointestinal: Negative for abdominal pain, nausea and vomiting.  Neurological: Positive for headaches. Negative for dizziness, seizures, syncope, facial asymmetry, speech difficulty, weakness, light-headedness and numbness.  All other systems reviewed and are negative.   Physical Exam Updated Vital Signs BP 119/85 (BP Location: Left Arm)   Pulse 93   Temp 98.7 F (37.1 C) (Oral)   Resp 18   Ht 5' 5.75" (1.67 m)   Wt 63.5 kg   LMP 11/20/2019 (Approximate)   SpO2 100%   BMI 22.77 kg/m   Physical Exam Vitals and nursing note reviewed.    Constitutional:      General: She is not in acute distress.    Appearance: Normal appearance. She is not toxic-appearing.  HENT:     Head: Normocephalic and atraumatic.     Mouth/Throat:     Pharynx: Oropharynx is clear. Uvula midline.  Eyes:     General: Vision grossly intact. Gaze aligned appropriately.     Extraocular Movements: Extraocular movements intact.     Conjunctiva/sclera: Conjunctivae normal.     Pupils: Pupils are equal, round, and reactive to light.     Comments: No proptosis.   Cardiovascular:     Rate and Rhythm: Normal rate and regular rhythm.  Pulmonary:     Effort: Pulmonary effort is normal.     Breath sounds: Normal breath sounds.  Abdominal:     General: There is no distension.     Palpations: Abdomen is soft.     Tenderness: There is no abdominal tenderness. There is no guarding or rebound.  Musculoskeletal:     Cervical back: Normal range of motion and neck supple. No rigidity.  Skin:    General: Skin is warm and dry.  Neurological:     Mental Status: She is alert.     Comments: Alert. Clear speech. No facial droop. CNIII-XII grossly intact. Bilateral upper and lower extremities' sensation grossly intact. 5/5 symmetric strength with grip strength and with plantar and dorsi flexion bilaterally . Normal finger to nose bilaterally. Negative pronator drift. Gait intact.    Psychiatric:        Mood and Affect: Mood normal.        Behavior: Behavior normal.     ED Results / Procedures / Treatments   Labs (all labs ordered are listed, but only abnormal results are displayed) Labs Reviewed  I-STAT BETA HCG BLOOD, ED ([facility], [facility], [facility] ONLY)    EKG None  Radiology CT Head Wo Contrast  Result Date: 12/21/2019 CLINICAL DATA:  Headache for 3 days EXAM: CT HEAD WITHOUT CONTRAST TECHNIQUE: Contiguous axial images were obtained from the base of the skull through the vertex without intravenous contrast. COMPARISON:  None. FINDINGS: Brain: No evidence of acute  infarction, hemorrhage, hydrocephalus, extra-axial collection or mass lesion/mass effect. Vascular: No hyperdense vessel or unexpected calcification. Skull: Normal. Negative for fracture or focal lesion. Sinuses/Orbits: No acute finding. Other: None. IMPRESSION: No acute intracranial pathology. Electronically Signed   By: Duanne Guess D.O.   On: 12/21/2019 12:45    Procedures Procedures (including critical care time)  Medications Ordered in ED Medications  prochlorperazine (COMPAZINE) injection 10 mg (10 mg Intravenous Given 12/21/19  1207)  diphenhydrAMINE (BENADRYL) injection 12.5 mg (12.5 mg Intravenous Given 12/21/19 1207)    ED Course  I have reviewed the triage vital signs and the nursing notes.  Pertinent labs & imaging results that were available during my care of the patient were reviewed by me and considered in my medical decision making (see chart for details).    MDM Rules/Calculators/A&P                      Patient presents with complaint of headache. Patient is nontoxic appearing with stable vital signs. Patient stated no history of similar headaches therefore head CT was obtained w/o acute process. Headaches have gradual onset with steady progression in severity, patient is afebrile with no focal neuro deficits, dizziness, change in vision, proptosis, or nuchal rigidity- overall non concerning for [facility], ICH, ischemic CVA, dural venous sinus thrombosis, acute glaucoma, giant cell arteritis, mass, or meningitis. Patient treated for headache with migraine cocktail with improvement, states she is ready to go home. I discussed treatment plan, need for PCP follow-up, and return precautions with the patient. Provided opportunity for questions, patient confirmed understanding and is in agreement with plan.   Final Clinical Impression(s) / ED Diagnoses Final diagnoses:  Acute nonintractable headache, unspecified headache type    Rx / DC Orders ED Discharge Orders    None         Cherly Anderson, PA-C 12/21/19 1318    Lorre Nick, MD 12/24/19 1214

## 2019-12-21 NOTE — ED Notes (Signed)
Patient stated she had to leave, she has a one year at home, she took her gallon of milk with her, escorted out. Did not want to wait on DC papers.

## 2020-02-25 ENCOUNTER — Other Ambulatory Visit: Payer: Self-pay

## 2020-02-25 ENCOUNTER — Emergency Department (HOSPITAL_BASED_OUTPATIENT_CLINIC_OR_DEPARTMENT_OTHER)
Admission: EM | Admit: 2020-02-25 | Discharge: 2020-02-25 | Disposition: A | Discharge status: Home / Self Care | Payer: Medicaid Other | Attending: Emergency Medicine | Admitting: Emergency Medicine

## 2020-02-25 ENCOUNTER — Emergency Department (HOSPITAL_BASED_OUTPATIENT_CLINIC_OR_DEPARTMENT_OTHER): Payer: Medicaid Other

## 2020-02-25 ENCOUNTER — Encounter (HOSPITAL_BASED_OUTPATIENT_CLINIC_OR_DEPARTMENT_OTHER): Payer: Self-pay | Admitting: Emergency Medicine

## 2020-02-25 DIAGNOSIS — M79602 Pain in left arm: Secondary | ICD-10-CM | POA: Insufficient documentation

## 2020-02-25 DIAGNOSIS — Z79899 Other long term (current) drug therapy: Secondary | ICD-10-CM | POA: Diagnosis not present

## 2020-02-25 DIAGNOSIS — Z880 Allergy status to penicillin: Secondary | ICD-10-CM | POA: Insufficient documentation

## 2020-02-25 DIAGNOSIS — F439 Reaction to severe stress, unspecified: Secondary | ICD-10-CM

## 2020-02-25 DIAGNOSIS — K0889 Other specified disorders of teeth and supporting structures: Secondary | ICD-10-CM | POA: Insufficient documentation

## 2020-02-25 DIAGNOSIS — N939 Abnormal uterine and vaginal bleeding, unspecified: Secondary | ICD-10-CM | POA: Diagnosis not present

## 2020-02-25 DIAGNOSIS — R0789 Other chest pain: Secondary | ICD-10-CM | POA: Insufficient documentation

## 2020-02-25 DIAGNOSIS — Z332 Encounter for elective termination of pregnancy: Secondary | ICD-10-CM

## 2020-02-25 LAB — CBC WITH DIFFERENTIAL/PLATELET
Abs Immature Granulocytes: 0.02 10*3/uL (ref 0.00–0.07)
Basophils Absolute: 0 10*3/uL (ref 0.0–0.1)
Basophils Relative: 1 %
Eosinophils Absolute: 0 10*3/uL (ref 0.0–0.5)
Eosinophils Relative: 0 %
HCT: 38 % (ref 36.0–46.0)
Hemoglobin: 13.1 g/dL (ref 12.0–15.0)
Immature Granulocytes: 0 %
Lymphocytes Relative: 28 %
Lymphs Abs: 1.9 10*3/uL (ref 0.7–4.0)
MCH: 31.7 pg (ref 26.0–34.0)
MCHC: 34.5 g/dL (ref 30.0–36.0)
MCV: 92 fL (ref 80.0–100.0)
Monocytes Absolute: 0.6 10*3/uL (ref 0.1–1.0)
Monocytes Relative: 8 %
Neutro Abs: 4.2 10*3/uL (ref 1.7–7.7)
Neutrophils Relative %: 63 %
Platelets: 426 10*3/uL — ABNORMAL HIGH (ref 150–400)
RBC: 4.13 MIL/uL (ref 3.87–5.11)
RDW: 11.7 % (ref 11.5–15.5)
WBC: 6.7 10*3/uL (ref 4.0–10.5)
nRBC: 0 % (ref 0.0–0.2)

## 2020-02-25 LAB — BASIC METABOLIC PANEL
Anion gap: 7 (ref 5–15)
BUN: 6 mg/dL (ref 6–20)
CO2: 23 mmol/L (ref 22–32)
Calcium: 8.9 mg/dL (ref 8.9–10.3)
Chloride: 109 mmol/L (ref 98–111)
Creatinine, Ser: 0.73 mg/dL (ref 0.44–1.00)
GFR calc Af Amer: >60 mL/min (ref >60)
GFR calc non Af Amer: >60 mL/min (ref >60)
Glucose, Bld: 107 mg/dL — ABNORMAL HIGH (ref 70–99)
Potassium: 3.8 mmol/L (ref 3.5–5.1)
Sodium: 139 mmol/L (ref 135–145)

## 2020-02-25 LAB — HCG, QUANTITATIVE, PREGNANCY: hCG, Beta Chain, Quant, S: 114 m[IU]/mL — ABNORMAL HIGH (ref <5)

## 2020-02-25 MED ORDER — NAPROXEN 500 MG PO TABS: 500.0000 mg | ORAL_TABLET | Freq: Two times a day (BID) | ORAL | 0 refills | Status: DC

## 2020-02-25 MED ORDER — KETOROLAC TROMETHAMINE 15 MG/ML IJ SOLN
15.0000 mg | Freq: Once | INTRAMUSCULAR | Status: DC
  Filled 2020-02-25: qty 1

## 2020-02-25 MED ORDER — CLINDAMYCIN HCL 150 MG PO CAPS: 150.0000 mg | ORAL_CAPSULE | Freq: Four times a day (QID) | ORAL | 0 refills | Status: DC

## 2020-02-25 NOTE — Discharge Instructions (Addendum)
You were seen today for multiple complaints including chest pain, dental pain, and concern for ongoing vaginal bleeding given your recent abortion.  Your chest pain work-up is reassuring.  Your EKG and chest x-ray are normal.  Take naproxen as needed for pain.  Regarding your dental pain, you will be given clindamycin to cover for dental infection.  Follow-up with your dentist as scheduled.  Regarding your elective abortion, your hemoglobin is normal.  Follow-up with your clinic for repeat beta hCG and ultrasound.

## 2020-02-25 NOTE — ED Triage Notes (Signed)
EMS transport from home. Pt reports left arm and left facial numbness x 3 days. She feels like it is related to her wisdom teeth. Also reports anxiety and family issues this evening which she states increased her Sx. She has her minor child with her due to having no childcare. She reported to EMS that she has been off her lexapro x 3 months and tonight she took a dose but it did not help. Also reports chest tightness (since she was a child per pt's report to EMS)

## 2020-02-25 NOTE — ED Notes (Signed)
Cab called for patient.
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
## 2020-02-25 NOTE — ED Notes (Signed)
ED Provider at bedside. 

## 2020-07-22 ENCOUNTER — Other Ambulatory Visit: Payer: Self-pay

## 2020-07-22 ENCOUNTER — Encounter (HOSPITAL_COMMUNITY): Payer: Self-pay

## 2020-07-22 ENCOUNTER — Emergency Department (HOSPITAL_COMMUNITY)
Admission: EM | Admit: 2020-07-22 | Discharge: 2020-07-22 | Disposition: A | Discharge status: Home / Self Care | Payer: Medicaid Other | Attending: Emergency Medicine | Admitting: Emergency Medicine

## 2020-07-22 ENCOUNTER — Ambulatory Visit (HOSPITAL_COMMUNITY)
Admission: EM | Admit: 2020-07-22 | Discharge: 2020-07-22 | Disposition: A | Discharge status: Home / Self Care | Payer: Medicaid Other | Attending: Family Medicine | Admitting: Family Medicine

## 2020-07-22 DIAGNOSIS — M542 Cervicalgia: Secondary | ICD-10-CM | POA: Diagnosis present

## 2020-07-22 DIAGNOSIS — Z5321 Procedure and treatment not carried out due to patient leaving prior to being seen by health care provider: Secondary | ICD-10-CM | POA: Diagnosis not present

## 2020-07-22 HISTORY — DX: Unspecified asthma, uncomplicated: J45.909

## 2020-07-22 LAB — HIV ANTIBODY (ROUTINE TESTING W REFLEX): HIV Screen 4th Generation wRfx: NONREACTIVE

## 2020-07-22 NOTE — ED Triage Notes (Signed)
Pt c/o enlarged areas around neckx2 days. Pt denies any other sx.

## 2020-07-22 NOTE — ED Triage Notes (Signed)
Pt presents to ED with complaints of neck pain that radiates around to front around throat x 2 days. Denies injury. Reports she works from home and uses computer frequently.

## 2020-07-23 NOTE — ED Provider Notes (Signed)
[facility] CARE CENTER   [id number] 07/22/20 Arrival Time: 1640  ASSESSMENT & PLAN:  1. Anterior neck pain     Unclear of exact etiology. Just noted yesterday. No abnormal PE findings. Reassured and encouraged close observation. Would like HIV testing; sent.    Follow-up Information    Norm Salt, [place].   Specialty: Physician Assistant Why: If worsening or failing to improve as anticipated. Contact information: [address] [place] [address] [phone]               Reviewed expectations re: course of current medical issues. Questions answered. Outlined signs and symptoms indicating need for more acute intervention. Patient verbalized understanding. After Visit Summary given.  SUBJECTIVE: History from: patient. Mary Green is a 24 y.o. female who reports feeling anterior neck discomfort. "Maybe felt like it was swollen". Noted yesterday. Afebrile. Normal swallowing and PO intake without n/v. No new foods. Breathing normally. No h/o similar. Would like HIV screening.   OBJECTIVE:  Vitals:   07/22/20 1724  BP: (!) 119/59  Pulse: 67  Resp: 16  Temp: 98.7 F (37.1 C)  TempSrc: Oral  SpO2: 100%  Weight: 74.4 kg  Height: 5' 4.75" (1.645 m)     GCS: 15 General appearance: alert; no distress HEENT: normocephalic; atraumatic; throat normal Neck: supple with FROM; no swelling or masses Lungs: clear to auscultation bilaterally; unlabored Skin: warm and dry Psychological: alert and cooperative; normal mood and affect    Labs Reviewed  HIV ANTIBODY (ROUTINE TESTING W REFLEX)    No results found.  Allergies  Allergen Reactions  . Penicillins Other (See Comments)    Childhood reaction   Past Medical History:  Diagnosis Date  . Anxiety 2009   previously on medications, seroquel, intuniv, risperdal, treated at [facility]   . Asthma    Past Surgical History:  Procedure Laterality Date  . NO PAST SURGERIES    . ROOT CANAL  2008     Family History  Problem Relation Age of Onset  . Sarcoidosis Mother   . Carpal tunnel syndrome Mother   . Asthma Sister   . Heart murmur Sister   . Cancer Neg Hx    Social History   Socioeconomic History  . Marital status: Legally Separated    Spouse name: Not on file  . Number of children: 0  . Years of education: 46   . Highest education level: Not on file  Occupational History  . Occupation: Consulting civil engineer    Comment: Part time   . Occupation: Occupational hygienist     CommentChief Technology Officer   Tobacco Use  . Smoking status: Former Smoker    Packs/day: 0.25    Years: 2.00    Pack years: 0.50    Types: Cigarettes  . Smokeless tobacco: Never Used  Vaping Use  . Vaping Use: Never used  Substance and Sexual Activity  . Alcohol use: Yes    Alcohol/week: 1.0 standard drink    Types: 1 Shots of liquor per week    Comment: twice monthly   . Drug use: No  . Sexual activity: Not Currently    Birth control/protection: Condom, None  Other Topics Concern  . Not on file  Social History Narrative   Lives alone.    Mom lives in [place].    Social Determinants of Health   Financial Resource Strain:   . Difficulty of Paying Living Expenses: Not on file  Food Insecurity:   . Worried About  Running Out of Food in the Last Year: Not on file  . Ran Out of Food in the Last Year: Not on file  Transportation Needs:   . Lack of Transportation (Medical): Not on file  . Lack of Transportation (Non-Medical): Not on file  Physical Activity:   . Days of Exercise per Week: Not on file  . Minutes of Exercise per Session: Not on file  Stress:   . Feeling of Stress : Not on file  Social Connections:   . Frequency of Communication with Friends and Family: Not on file  . Frequency of Social Gatherings with Friends and Family: Not on file  . Attends Religious Services: Not on file  . Active Member of Clubs or Organizations: Not on file  . Attends Banker  Meetings: Not on file  . Marital Status: Not on file          Mardella Layman, MD 07/23/20 1044

## 2020-07-29 ENCOUNTER — Emergency Department (HOSPITAL_COMMUNITY)
Admission: EM | Admit: 2020-07-29 | Discharge: 2020-07-30 | Disposition: A | Discharge status: Home / Self Care | Payer: Medicaid Other | Attending: Emergency Medicine | Admitting: Emergency Medicine

## 2020-07-29 ENCOUNTER — Ambulatory Visit (HOSPITAL_COMMUNITY)
Admission: EM | Admit: 2020-07-29 | Discharge: 2020-07-29 | Disposition: A | Discharge status: Home / Self Care | Payer: Medicaid Other

## 2020-07-29 ENCOUNTER — Other Ambulatory Visit: Payer: Self-pay

## 2020-07-29 ENCOUNTER — Encounter (HOSPITAL_COMMUNITY): Payer: Self-pay | Admitting: Emergency Medicine

## 2020-07-29 DIAGNOSIS — J45909 Unspecified asthma, uncomplicated: Secondary | ICD-10-CM | POA: Diagnosis not present

## 2020-07-29 DIAGNOSIS — Z87891 Personal history of nicotine dependence: Secondary | ICD-10-CM | POA: Insufficient documentation

## 2020-07-29 DIAGNOSIS — T7421XA Adult sexual abuse, confirmed, initial encounter: Secondary | ICD-10-CM | POA: Diagnosis not present

## 2020-07-29 DIAGNOSIS — Z206 Contact with and (suspected) exposure to human immunodeficiency virus [HIV]: Secondary | ICD-10-CM | POA: Diagnosis present

## 2020-07-29 MED ORDER — ELVITEG-COBIC-EMTRICIT-TENOFAF 150-150-200-10 MG PREPACK
5.0000 | ORAL_TABLET | Freq: Once | ORAL | Status: AC
  Administered 2020-07-30: 5 via ORAL
  Filled 2020-07-29: qty 1

## 2020-07-29 MED ORDER — ELVITEG-COBIC-EMTRICIT-TENOFAF 150-150-200-10 MG PO TABS: 1.0000 | ORAL_TABLET | Freq: Every day | ORAL | 0 refills | Status: DC

## 2020-07-29 NOTE — Discharge Instructions (Addendum)
Thank you for allowing me to care for you today in the Emergency Department.   Your prescriptions have been sent to your pharmacy.  I have also placed a consult to our social work team.  They should be reaching out to you tomorrow to address questions or concerns about cost of medications.  Take 1 tablet of Flagyl 2 times daily for the next 7 days.  Do not drink alcohol with this medication because it can cause vomiting.  Take 1 tablet of doxycycline 2 times daily for the next 7 days.  Take 1 tablet of Genvoya daily for the next 27 days.  You took a dose of this tonight in the emergency department.  It is important you complete the entire 28 days of this medication.  Because you were inadvertently given multiple tablets of the Genvoya, you may have some nausea or loose stools.  We discussed this with poison control and they did not have any other concerns.  Do not take your next tablet of Genvoya until tomorrow.  Please follow-up with the infectious disease clinic for repeat HIV test in approximately 6 weeks.  It is very important that you follow-up with your primary care doctor or the health department for further evaluation.  Return to the emergency department if you develop uncontrollable vomiting, if you feel unsafe at home, develop uncontrollable abdominal pain, or other new, concerning symptoms.  Follow-up Recommendations: - Check HIV Ab at 6 weeks, and a 4th generation Ag/Ab test or HIV RNA test at 3-4 months. If these tests are not available, standard antibody testing should be performed at 3 and 6 months. Symptoms of acute HIV should prompt immediate evaluation.
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
## 2020-07-30 ENCOUNTER — Telehealth: Payer: Self-pay | Admitting: Pharmacy Technician

## 2020-07-30 LAB — POC URINE PREG, ED: Preg Test, Ur: NEGATIVE

## 2020-07-30 MED ORDER — STERILE WATER FOR INJECTION IJ SOLN
INTRAMUSCULAR | Status: AC
  Filled 2020-07-30: qty 10

## 2020-07-30 MED ORDER — CEFTRIAXONE SODIUM 500 MG IJ SOLR
500.0000 mg | Freq: Once | INTRAMUSCULAR | Status: AC
  Administered 2020-07-30: 500 mg via INTRAMUSCULAR
  Filled 2020-07-30: qty 500

## 2020-07-30 MED ORDER — METRONIDAZOLE 500 MG PO TABS: 500.0000 mg | ORAL_TABLET | Freq: Two times a day (BID) | ORAL | 0 refills | Status: DC

## 2020-07-30 MED ORDER — DOXYCYCLINE HYCLATE 100 MG PO CAPS: 100.0000 mg | ORAL_CAPSULE | Freq: Two times a day (BID) | ORAL | 0 refills | Status: AC

## 2020-07-30 MED ORDER — ELVITEG-COBIC-EMTRICIT-TENOFAF 150-150-200-10 MG PO TABS: 1.0000 | ORAL_TABLET | Freq: Every day | ORAL | 0 refills | Status: DC
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
## 2020-08-08 ENCOUNTER — Ambulatory Visit (HOSPITAL_COMMUNITY)
Admission: EM | Admit: 2020-08-08 | Discharge: 2020-08-08 | Disposition: A | Discharge status: Home / Self Care | Payer: Medicaid Other | Attending: Family Medicine | Admitting: Family Medicine

## 2020-08-08 ENCOUNTER — Encounter (HOSPITAL_COMMUNITY): Payer: Self-pay | Admitting: Emergency Medicine

## 2020-08-08 ENCOUNTER — Other Ambulatory Visit: Payer: Self-pay

## 2020-08-08 DIAGNOSIS — M2550 Pain in unspecified joint: Secondary | ICD-10-CM

## 2020-08-08 DIAGNOSIS — R109 Unspecified abdominal pain: Secondary | ICD-10-CM

## 2020-08-08 LAB — CBC WITH DIFFERENTIAL/PLATELET
Abs Immature Granulocytes: 0.01 10*3/uL (ref 0.00–0.07)
Basophils Absolute: 0 10*3/uL (ref 0.0–0.1)
Basophils Relative: 1 %
Eosinophils Absolute: 0.1 10*3/uL (ref 0.0–0.5)
Eosinophils Relative: 1 %
HCT: 43.2 % (ref 36.0–46.0)
Hemoglobin: 14.2 g/dL (ref 12.0–15.0)
Immature Granulocytes: 0 %
Lymphocytes Relative: 36 %
Lymphs Abs: 2.3 10*3/uL (ref 0.7–4.0)
MCH: 30 pg (ref 26.0–34.0)
MCHC: 32.9 g/dL (ref 30.0–36.0)
MCV: 91.1 fL (ref 80.0–100.0)
Monocytes Absolute: 0.5 10*3/uL (ref 0.1–1.0)
Monocytes Relative: 8 %
Neutro Abs: 3.4 10*3/uL (ref 1.7–7.7)
Neutrophils Relative %: 54 %
Platelets: 319 10*3/uL (ref 150–400)
RBC: 4.74 MIL/uL (ref 3.87–5.11)
RDW: 12 % (ref 11.5–15.5)
WBC: 6.3 10*3/uL (ref 4.0–10.5)
nRBC: 0 % (ref 0.0–0.2)

## 2020-08-08 LAB — POCT URINALYSIS DIPSTICK, ED / UC
Bilirubin Urine: NEGATIVE
Glucose, UA: NEGATIVE mg/dL
Hgb urine dipstick: NEGATIVE
Ketones, ur: NEGATIVE mg/dL
Leukocytes,Ua: NEGATIVE
Nitrite: NEGATIVE
Protein, ur: NEGATIVE mg/dL
Specific Gravity, Urine: 1.02 (ref 1.005–1.030)
Urobilinogen, UA: 0.2 mg/dL (ref 0.0–1.0)
pH: 6 (ref 5.0–8.0)

## 2020-08-08 LAB — COMPREHENSIVE METABOLIC PANEL
ALT: 12 U/L (ref 0–44)
AST: 17 U/L (ref 15–41)
Albumin: 4.2 g/dL (ref 3.5–5.0)
Alkaline Phosphatase: 48 U/L (ref 38–126)
Anion gap: 11 (ref 5–15)
BUN: 9 mg/dL (ref 6–20)
CO2: 24 mmol/L (ref 22–32)
Calcium: 9.5 mg/dL (ref 8.9–10.3)
Chloride: 103 mmol/L (ref 98–111)
Creatinine, Ser: 0.82 mg/dL (ref 0.44–1.00)
GFR, Estimated: >60 mL/min (ref >60)
Glucose, Bld: 69 mg/dL — ABNORMAL LOW (ref 70–99)
Potassium: 3.6 mmol/L (ref 3.5–5.1)
Sodium: 138 mmol/L (ref 135–145)
Total Bilirubin: 0.1 mg/dL — ABNORMAL LOW (ref 0.3–1.2)
Total Protein: 7.3 g/dL (ref 6.5–8.1)
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
## 2020-08-08 NOTE — ED Triage Notes (Signed)
Pt presents with kidney, groin, and joint pain after starting Genvoya that was prescribed in the ED. Pt states she stopped the medication 3 days ago.

## 2020-08-16 ENCOUNTER — Encounter (HOSPITAL_COMMUNITY): Payer: Self-pay | Admitting: Emergency Medicine

## 2020-08-16 ENCOUNTER — Other Ambulatory Visit: Payer: Self-pay

## 2020-08-16 ENCOUNTER — Emergency Department (HOSPITAL_COMMUNITY)
Admission: EM | Admit: 2020-08-16 | Discharge: 2020-08-17 | Disposition: A | Discharge status: Home / Self Care | Payer: Medicaid Other | Attending: Emergency Medicine | Admitting: Emergency Medicine

## 2020-08-16 DIAGNOSIS — Z87891 Personal history of nicotine dependence: Secondary | ICD-10-CM | POA: Insufficient documentation

## 2020-08-16 DIAGNOSIS — R1012 Left upper quadrant pain: Secondary | ICD-10-CM | POA: Diagnosis not present

## 2020-08-16 DIAGNOSIS — J45909 Unspecified asthma, uncomplicated: Secondary | ICD-10-CM | POA: Insufficient documentation

## 2020-08-16 LAB — COMPREHENSIVE METABOLIC PANEL
ALT: 11 U/L (ref 0–44)
AST: 16 U/L (ref 15–41)
Albumin: 4.3 g/dL (ref 3.5–5.0)
Alkaline Phosphatase: 41 U/L (ref 38–126)
Anion gap: 10 (ref 5–15)
BUN: 9 mg/dL (ref 6–20)
CO2: 23 mmol/L (ref 22–32)
Calcium: 9.5 mg/dL (ref 8.9–10.3)
Chloride: 102 mmol/L (ref 98–111)
Creatinine, Ser: 0.85 mg/dL (ref 0.44–1.00)
GFR, Estimated: >60 mL/min (ref >60)
Glucose, Bld: 100 mg/dL — ABNORMAL HIGH (ref 70–99)
Potassium: 3.3 mmol/L — ABNORMAL LOW (ref 3.5–5.1)
Sodium: 135 mmol/L (ref 135–145)
Total Bilirubin: 0.5 mg/dL (ref 0.3–1.2)
Total Protein: 7.2 g/dL (ref 6.5–8.1)

## 2020-08-16 LAB — CBC
HCT: 41.5 % (ref 36.0–46.0)
Hemoglobin: 13.6 g/dL (ref 12.0–15.0)
MCH: 29.4 pg (ref 26.0–34.0)
MCHC: 32.8 g/dL (ref 30.0–36.0)
MCV: 89.8 fL (ref 80.0–100.0)
Platelets: 324 10*3/uL (ref 150–400)
RBC: 4.62 MIL/uL (ref 3.87–5.11)
RDW: 12 % (ref 11.5–15.5)
WBC: 6.4 10*3/uL (ref 4.0–10.5)
nRBC: 0 % (ref 0.0–0.2)

## 2020-08-16 LAB — URINALYSIS, ROUTINE W REFLEX MICROSCOPIC
Bilirubin Urine: NEGATIVE
Glucose, UA: NEGATIVE mg/dL
Hgb urine dipstick: NEGATIVE
Ketones, ur: NEGATIVE mg/dL
Leukocytes,Ua: NEGATIVE
Nitrite: NEGATIVE
Protein, ur: NEGATIVE mg/dL
Specific Gravity, Urine: 1.023 (ref 1.005–1.030)
pH: 5 (ref 5.0–8.0)

## 2020-08-16 LAB — I-STAT BETA HCG BLOOD, ED (MC, WL, AP ONLY): I-stat hCG, quantitative: <5 m[IU]/mL (ref <5)

## 2020-08-16 LAB — LIPASE, BLOOD: Lipase: 27 U/L (ref 11–51)

## 2020-08-16 NOTE — ED Triage Notes (Signed)
Patient reports LUQ pain for 2 days , denies emesis or diarrhea , no fever or chills .

## 2020-08-17 MED ORDER — HYDROXYZINE HCL 25 MG PO TABS: 25.0000 mg | ORAL_TABLET | Freq: Four times a day (QID) | ORAL | 0 refills | Status: DC

## 2020-08-17 MED ORDER — FAMOTIDINE 20 MG PO TABS: 20.0000 mg | ORAL_TABLET | Freq: Two times a day (BID) | ORAL | 0 refills | Status: DC
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
## 2020-08-17 NOTE — Discharge Instructions (Addendum)
Please take hydroxyzine for anxiety.  You may use it during the day every 6 hours I recommend doing this first to see how drowsy it makes you feel.  You may also use it at nighttime.  It may cause some slight dry mouth but no other significant symptoms.  I also recommend taking famotidine 20 mg once in the morning once in the evening.

## 2020-09-03 ENCOUNTER — Ambulatory Visit (HOSPITAL_COMMUNITY)
Admission: EM | Admit: 2020-09-03 | Discharge: 2020-09-03 | Disposition: A | Discharge status: Home / Self Care | Payer: Medicaid Other | Attending: Family Medicine | Admitting: Family Medicine

## 2020-09-03 ENCOUNTER — Encounter (HOSPITAL_COMMUNITY): Payer: Self-pay | Admitting: Emergency Medicine

## 2020-09-03 ENCOUNTER — Ambulatory Visit (INDEPENDENT_AMBULATORY_CARE_PROVIDER_SITE_OTHER): Payer: Medicaid Other

## 2020-09-03 ENCOUNTER — Other Ambulatory Visit: Payer: Self-pay

## 2020-09-03 DIAGNOSIS — R079 Chest pain, unspecified: Secondary | ICD-10-CM

## 2020-09-03 DIAGNOSIS — F17200 Nicotine dependence, unspecified, uncomplicated: Secondary | ICD-10-CM | POA: Diagnosis not present

## 2020-09-03 DIAGNOSIS — R252 Cramp and spasm: Secondary | ICD-10-CM | POA: Insufficient documentation

## 2020-09-03 LAB — CBC
HCT: 46.3 % — ABNORMAL HIGH (ref 36.0–46.0)
Hemoglobin: 15.4 g/dL — ABNORMAL HIGH (ref 12.0–15.0)
MCH: 29.7 pg (ref 26.0–34.0)
MCHC: 33.3 g/dL (ref 30.0–36.0)
MCV: 89.4 fL (ref 80.0–100.0)
Platelets: 435 10*3/uL — ABNORMAL HIGH (ref 150–400)
RBC: 5.18 MIL/uL — ABNORMAL HIGH (ref 3.87–5.11)
RDW: 11.9 % (ref 11.5–15.5)
WBC: 5.2 10*3/uL (ref 4.0–10.5)
nRBC: 0 % (ref 0.0–0.2)

## 2020-09-03 LAB — COMPREHENSIVE METABOLIC PANEL
ALT: 13 U/L (ref 0–44)
AST: 18 U/L (ref 15–41)
Albumin: 4.5 g/dL (ref 3.5–5.0)
Alkaline Phosphatase: 60 U/L (ref 38–126)
Anion gap: 10 (ref 5–15)
BUN: 6 mg/dL (ref 6–20)
CO2: 26 mmol/L (ref 22–32)
Calcium: 10 mg/dL (ref 8.9–10.3)
Chloride: 102 mmol/L (ref 98–111)
Creatinine, Ser: 0.84 mg/dL (ref 0.44–1.00)
GFR, Estimated: >60 mL/min (ref >60)
Glucose, Bld: 94 mg/dL (ref 70–99)
Potassium: 4 mmol/L (ref 3.5–5.1)
Sodium: 138 mmol/L (ref 135–145)
Total Bilirubin: 0.8 mg/dL (ref 0.3–1.2)
Total Protein: 7.8 g/dL (ref 6.5–8.1)

## 2020-09-03 LAB — TSH: TSH: 3.597 u[IU]/mL (ref 0.350–4.500)

## 2020-09-03 NOTE — Discharge Instructions (Addendum)
You have been seen at the [facility] today for chest pain. Your evaluation today was not suggestive of any emergent condition requiring medical intervention at this time. Your ECG (heart tracing) did not show any worrisome changes. However, some medical problems make take more time to appear. Therefore, it's very important that you pay attention to any new symptoms or worsening of your current condition.  Please proceed directly to the Emergency Department immediately should you feel worse in any way or have any of the following symptoms: increasing or different chest pain, pain that spreads to your arm, neck, jaw, back or abdomen, shortness of breath, or nausea and vomiting.  

## 2020-09-03 NOTE — ED Triage Notes (Signed)
Pain in both lower legs and numbness below knees.  Left chest pain started yesterday.  Pain is sharp and dull. Pain in left forearm.  Legs 3 days of discomfort, chest 2 days and left forearm today.  Patient has called EMS and they told her she was fine

## 2020-09-06 NOTE — ED Provider Notes (Signed)
[facility]   [id number] 09/03/20 Arrival Time: 1404  ASSESSMENT & PLAN:  1. Chest pain, unspecified type   2. Leg cramping     Patient history and exam consistent with non-cardiac cause of chest pain. Conservative measures indicated. OTC analgesics as needed.  ECG: Performed today and interpreted by me: Normal sinus rhythm Normal ECG No significant change since last tracing  I have personally viewed the imaging studies ordered this visit. Normal CXR. No signs of infection. No pneumothorax.    Discharge Instructions     You have been seen at the [facility] today for chest pain. Your evaluation today was not suggestive of any emergent condition requiring medical intervention at this time. Your ECG (heart tracing) did not show any worrisome changes. However, some medical problems make take more time to appear. Therefore, it's very important that you pay attention to any new symptoms or worsening of your current condition.  Please proceed directly to the Emergency Department immediately should you feel worse in any way or have any of the following symptoms: increasing or different chest pain, pain that spreads to your arm, neck, jaw, back or abdomen, shortness of breath, or nausea and vomiting.      Chest pain precautions given. Reviewed expectations re: course of current medical issues. Questions answered. Outlined signs and symptoms indicating need for more acute intervention. Patient verbalized understanding. After Visit Summary given.   SUBJECTIVE:  History from: patient. Mary Green is a 24 y.o. female who presents with complaint of intermittent chest pain without SOB and "tingling in my legs". First noted yesterday. Chest pain described as alternating between sharp and dull. Called EMS to her house. "Told my I was fine but I want to make sure I'm not gonna die." Ambulatory without difficulty. No recent illnesses. Afebrile. No abdominal or  back pain. No injury/trauama.  Denies: fatigue, irregular heart beat, near-syncope, orthopnea, palpitations and paroxysmal nocturnal dyspnea. Aggravating factors: have not been identified. Alleviating factors: have not been identified. Self/OTC treatment: none. History of similar: reports h/o chest pains. Illicit drug use: denied.  Social History   Tobacco Use  Smoking Status Former Smoker  . Packs/day: 0.25  . Years: 2.00  . Pack years: 0.50  . Types: Cigarettes  Smokeless Tobacco Never Used   Social History   Substance and Sexual Activity  Alcohol Use Yes  . Alcohol/week: 1.0 standard drink  . Types: 1 Shots of liquor per week   Comment: twice monthly       OBJECTIVE:  Vitals:   09/03/20 1506  BP: 121/79  Pulse: 92  Resp: 20  Temp: 98.1 F (36.7 C)  TempSrc: Oral  SpO2: 100%    General appearance: alert, oriented, no acute distress Eyes: PERRLA; EOMI; conjunctivae normal HENT: normocephalic; atraumatic Neck: supple with FROM Lungs: without labored respirations; speaks full sentences without difficulty; CTAB Heart: regular rate and rhythm without murmer Chest Wall: without tenderness to palpation Abdomen: soft, non-tender; no guarding or rebound tenderness Extremities: without edema; without calf swelling or tenderness; symmetrical without gross deformities Skin: warm and dry; without rash or lesions Neuro: normal gait Psychological: alert and cooperative; normal mood and affect  Labs: Results for orders placed or performed during the hospital encounter of 09/03/20  CBC  Result Value Ref Range   WBC 5.2 4.0 - 10.5 K/uL   RBC 5.18 (H) 3.87 - 5.11 MIL/uL   Hemoglobin 15.4 (H) 12.0 - 15.0 g/dL   HCT 61.4 (H) 36 -  46 %   MCV 89.4 80.0 - 100.0 fL   MCH 29.7 26.0 - 34.0 pg   MCHC 33.3 30.0 - 36.0 g/dL   RDW 08.1 44.8 - 18.5 %   Platelets 435 (H) 150 - 400 K/uL   nRBC 0.0 0.0 - 0.2 %  Comprehensive metabolic panel  Result Value Ref Range   Sodium 138 135  - 145 mmol/L   Potassium 4.0 3.5 - 5.1 mmol/L   Chloride 102 98 - 111 mmol/L   CO2 26 22 - 32 mmol/L   Glucose, Bld 94 70 - 99 mg/dL   BUN 6 6 - 20 mg/dL   Creatinine, Ser 6.31 0.44 - 1.00 mg/dL   Calcium 49.7 8.9 - 02.6 mg/dL   Total Protein 7.8 6.5 - 8.1 g/dL   Albumin 4.5 3.5 - 5.0 g/dL   AST 18 15 - 41 U/L   ALT 13 0 - 44 U/L   Alkaline Phosphatase 60 38 - 126 U/L   Total Bilirubin 0.8 0.3 - 1.2 mg/dL   GFR, Estimated >37 >85 mL/min   Anion gap 10 5 - 15  TSH  Result Value Ref Range   TSH 3.597 0.350 - 4.500 uIU/mL     Allergies  Allergen Reactions  . Penicillins Other (See Comments)    Childhood reaction    Past Medical History:  Diagnosis Date  . Anxiety 2009   previously on medications, seroquel, intuniv, risperdal, treated at [facility]   . Asthma    Social History   Socioeconomic History  . Marital status: Legally Separated    Spouse name: Not on file  . Number of children: 0  . Years of education: 4   . Highest education level: Not on file  Occupational History  . Occupation: Consulting civil engineer    Comment: Part time   . Occupation: Occupational hygienist     CommentChief Technology Officer   Tobacco Use  . Smoking status: Former Smoker    Packs/day: 0.25    Years: 2.00    Pack years: 0.50    Types: Cigarettes  . Smokeless tobacco: Never Used  Vaping Use  . Vaping Use: Never used  Substance and Sexual Activity  . Alcohol use: Yes    Alcohol/week: 1.0 standard drink    Types: 1 Shots of liquor per week    Comment: twice monthly   . Drug use: No  . Sexual activity: Not Currently    Birth control/protection: Condom, None  Other Topics Concern  . Not on file  Social History Narrative   Lives alone.    Mom lives in [place].    Social Determinants of Health   Financial Resource Strain:   . Difficulty of Paying Living Expenses: Not on file  Food Insecurity:   . Worried About Programme researcher, broadcasting/film/video in the Last Year: Not on file  . Ran Out of  Food in the Last Year: Not on file  Transportation Needs:   . Lack of Transportation (Medical): Not on file  . Lack of Transportation (Non-Medical): Not on file  Physical Activity:   . Days of Exercise per Week: Not on file  . Minutes of Exercise per Session: Not on file  Stress:   . Feeling of Stress : Not on file  Social Connections:   . Frequency of Communication with Friends and Family: Not on file  . Frequency of Social Gatherings with Friends and Family: Not on file  . Attends Religious Services: Not on file  .  Active Member of Clubs or Organizations: Not on file  . Attends Banker Meetings: Not on file  . Marital Status: Not on file  Intimate Partner Violence:   . Fear of Current or Ex-Partner: Not on file  . Emotionally Abused: Not on file  . Physically Abused: Not on file  . Sexually Abused: Not on file   Family History  Problem Relation Age of Onset  . Sarcoidosis Mother   . Carpal tunnel syndrome Mother   . Asthma Sister   . Heart murmur Sister   . Cancer Neg Hx    Past Surgical History:  Procedure Laterality Date  . NO PAST SURGERIES    . ROOT CANAL  2008      Mardella Layman, MD 09/06/20 1026

## 2020-09-18 ENCOUNTER — Encounter (HOSPITAL_COMMUNITY): Payer: Self-pay

## 2020-09-18 ENCOUNTER — Ambulatory Visit (HOSPITAL_COMMUNITY)
Admission: EM | Admit: 2020-09-18 | Discharge: 2020-09-18 | Disposition: A | Discharge status: Home / Self Care | Payer: Medicaid Other | Attending: Family Medicine | Admitting: Family Medicine

## 2020-09-18 ENCOUNTER — Other Ambulatory Visit: Payer: Self-pay

## 2020-09-18 DIAGNOSIS — Z3202 Encounter for pregnancy test, result negative: Secondary | ICD-10-CM | POA: Diagnosis not present

## 2020-09-18 DIAGNOSIS — Z711 Person with feared health complaint in whom no diagnosis is made: Secondary | ICD-10-CM | POA: Insufficient documentation

## 2020-09-18 DIAGNOSIS — N760 Acute vaginitis: Secondary | ICD-10-CM | POA: Diagnosis not present

## 2020-09-18 LAB — POCT URINALYSIS DIPSTICK, ED / UC
Bilirubin Urine: NEGATIVE
Glucose, UA: NEGATIVE mg/dL
Ketones, ur: NEGATIVE mg/dL
Nitrite: NEGATIVE
Protein, ur: NEGATIVE mg/dL
Specific Gravity, Urine: 1.02 (ref 1.005–1.030)
Urobilinogen, UA: 0.2 mg/dL (ref 0.0–1.0)
pH: 7 (ref 5.0–8.0)

## 2020-09-18 LAB — POC URINE PREG, ED: Preg Test, Ur: NEGATIVE

## 2020-09-18 LAB — HIV ANTIBODY (ROUTINE TESTING W REFLEX): HIV Screen 4th Generation wRfx: NONREACTIVE

## 2020-09-18 MED ORDER — FLUCONAZOLE 150 MG PO TABS: 150.0000 mg | ORAL_TABLET | Freq: Once | ORAL | 0 refills | Status: AC

## 2020-09-18 MED ORDER — CEFTRIAXONE SODIUM 500 MG IJ SOLR
INTRAMUSCULAR | Status: AC
  Filled 2020-09-18: qty 500

## 2020-09-18 MED ORDER — LIDOCAINE HCL (PF) 1 % IJ SOLN
INTRAMUSCULAR | Status: AC
  Filled 2020-09-18: qty 2

## 2020-09-18 MED ORDER — CEFTRIAXONE SODIUM 500 MG IJ SOLR
500.0000 mg | Freq: Once | INTRAMUSCULAR | Status: AC
  Administered 2020-09-18: 500 mg via INTRAMUSCULAR

## 2020-09-18 NOTE — ED Triage Notes (Signed)
Pt presents with vaginal discharge and itching X 3 days.  

## 2020-09-18 NOTE — Discharge Instructions (Signed)
Your blood work results will be available by the end of day and will be available for review on your MyChart.  Your vaginal cytology swab will result within the next 72 hours.  If any additional treatment is warranted we will send the medication to the pharmacy.  You have been covered for gonorrhea while you are here in clinic today with Rocephin 500 mg.
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
## 2020-09-19 LAB — RPR: RPR Ser Ql: NONREACTIVE

## 2020-09-19 LAB — CERVICOVAGINAL ANCILLARY ONLY
Bacterial Vaginitis (gardnerella): NEGATIVE
Candida Glabrata: NEGATIVE
Candida Vaginitis: POSITIVE — AB
Chlamydia: NEGATIVE
Comment: NEGATIVE
Comment: NEGATIVE
Comment: NEGATIVE
Comment: NEGATIVE
Comment: NEGATIVE
Comment: NORMAL
Neisseria Gonorrhea: NEGATIVE
Trichomonas: NEGATIVE

## 2020-10-21 ENCOUNTER — Encounter (HOSPITAL_COMMUNITY): Payer: Self-pay

## 2020-10-21 ENCOUNTER — Ambulatory Visit (HOSPITAL_COMMUNITY)
Admission: EM | Admit: 2020-10-21 | Discharge: 2020-10-21 | Disposition: A | Discharge status: Home / Self Care | Payer: Medicaid Other | Attending: Student | Admitting: Student

## 2020-10-21 DIAGNOSIS — K1120 Sialoadenitis, unspecified: Secondary | ICD-10-CM | POA: Diagnosis not present

## 2020-10-21 MED ORDER — CLINDAMYCIN HCL 150 MG PO CAPS: 450.0000 mg | ORAL_CAPSULE | Freq: Three times a day (TID) | ORAL | 0 refills | Status: AC

## 2020-10-21 NOTE — Discharge Instructions (Addendum)
For your infection, start Clindamycin. Take 3 pills (450mg ) three times daily.  Seek additional medical treatment if your infection worsens despite treatment; if you develops fevers/chills; if you have worsening nausea/diarrhea/etc.

## 2020-10-21 NOTE — ED Provider Notes (Signed)
[facility]    CSN: [id number] Arrival date & time: 10/21/20  1621      History   Chief Complaint Chief Complaint  Patient presents with  . Headache  . Neck Pain    HPI Mary Green is a 24 y.o. female presenting for pain that radiates from her jaw to behind her left ear. History of anxiety, asthma. States she's had 3 days of 3/10 pain that seems to be getting worse. Also states her cheek and inside her mouth seems a bit swollen. Desiring treatment to prevent infection during christmas. Denies fevers/chills, n/v/d, shortness of breath, chest pain, cough, congestion, facial pain, teeth pain, headaches, sore throat, loss of taste/smell, swollen lymph nodes, hearing changes, dizziness, ear discharge. Feeling well otherwise.   HPI  Past Medical History:  Diagnosis Date  . Anxiety 2009   previously on medications, seroquel, intuniv, risperdal, treated at [facility]   . Asthma     Patient Active Problem List   Diagnosis Date Noted  . Supervision of normal first pregnancy, antepartum 01/31/2018  . Amenorrhea 09/09/2014  . Panic attacks 09/09/2014  . Vaginal itching 09/09/2014    Past Surgical History:  Procedure Laterality Date  . NO PAST SURGERIES    . ROOT CANAL  2008     OB History    Gravida  1   Para  0   Term  0   Preterm  0   AB  0   Living  0     SAB  0   IAB  0   Ectopic  0   Multiple  0   Live Births  0            Home Medications    Prior to Admission medications   Medication Sig Start Date End Date Taking? Authorizing Provider  clindamycin (CLEOCIN) 150 MG capsule Take 3 capsules (450 mg total) by mouth 3 (three) times daily for 3 days. 10/21/20 10/24/20  Rhys Martini, PA-C  famotidine (PEPCID) 20 MG tablet Take 1 tablet (20 mg total) by mouth 2 (two) times daily. 08/17/20   Gailen Shelter, PA  hydrOXYzine (ATARAX/VISTARIL) 25 MG tablet Take 1 tablet (25 mg total) by mouth every 6 (six) hours. 08/17/20   Gailen Shelter, PA  ibuprofen (ADVIL,MOTRIN) 200 MG tablet Take 400 mg by mouth every 6 (six) hours as needed for headache or moderate pain.    [provider]  VALACYCLOVIR HCL PO Take by mouth.    [provider]  FLUoxetine (PROZAC) 20 MG tablet Take 1 tablet (20 mg total) by mouth daily. Patient not taking: Reported on 12/21/2019 09/09/14 12/21/19  Dessa Phi, MD  metoCLOPramide (REGLAN) 10 MG tablet Take 1 tablet (10 mg total) by mouth every 6 (six) hours as needed for nausea or vomiting. Patient not taking: Reported on 12/21/2019 01/31/18 12/21/19  Pincus Large, DO    Family History Family History  Problem Relation Age of Onset  . Sarcoidosis Mother   . Carpal tunnel syndrome Mother   . Asthma Sister   . Heart murmur Sister   . Cancer Neg Hx     Social History Social History   Tobacco Use  . Smoking status: Former Smoker    Packs/day: 0.25    Years: 2.00    Pack years: 0.50    Types: Cigarettes  . Smokeless tobacco: Never Used  Vaping Use  . Vaping Use: Never used  Substance Use Topics  .  Alcohol use: Yes    Alcohol/week: 1.0 standard drink    Types: 1 Shots of liquor per week    Comment: twice monthly   . Drug use: No     Allergies   Penicillins   Review of Systems Review of Systems  HENT: Positive for dental problem.   All other systems reviewed and are negative.    Physical Exam Triage Vital Signs ED Triage Vitals  Enc Vitals Group     BP 10/21/20 1730 128/72     Pulse Rate 10/21/20 1730 92     Resp 10/21/20 1730 18     Temp 10/21/20 1730 98.8 F (37.1 C)     Temp Source 10/21/20 1730 Oral     SpO2 10/21/20 1730 100 %     Weight --      Height --      Head Circumference --      Peak Flow --      Pain Score 10/21/20 1729 6     Pain Loc --      Pain Edu? --      Excl. in GC? --    No data found.  Updated Vital Signs BP 128/72 (BP Location: Right Arm)   Pulse 92   Temp 98.8 F (37.1 C) (Oral)   Resp 18   SpO2 100%    Visual Acuity Right Eye Distance:   Left Eye Distance:   Bilateral Distance:    Right Eye Near:   Left Eye Near:    Bilateral Near:     Physical Exam Vitals reviewed.  Constitutional:      General: She is not in acute distress.    Appearance: Normal appearance. She is not ill-appearing.  HENT:     Head: Normocephalic and atraumatic.     Jaw: No tenderness, swelling, pain on movement or malocclusion.     Salivary Glands: Right salivary gland is not diffusely enlarged or tender. Left salivary gland is diffusely enlarged and tender.     Comments: Left cheek mildly swollen and tenderness with palpation along parotid gland inside mouth    Right Ear: Hearing, tympanic membrane, ear canal and external ear normal. No decreased hearing noted. No drainage, swelling or tenderness. No middle ear effusion. There is no impacted cerumen. No foreign body. No mastoid tenderness. Tympanic membrane is not scarred, perforated, erythematous, retracted or bulging.     Left Ear: Hearing, tympanic membrane, ear canal and external ear normal. No decreased hearing noted. No drainage, swelling or tenderness.  No middle ear effusion. There is no impacted cerumen. No foreign body. No mastoid tenderness. Tympanic membrane is not scarred, perforated, erythematous, retracted or bulging.     Nose: Nose normal.     Mouth/Throat:     Lips: Pink.     Mouth: Mucous membranes are moist. No injury, lacerations, oral lesions or angioedema.     Dentition: Normal dentition. Does not have dentures. No dental tenderness, gingival swelling, dental caries, dental abscesses or gum lesions.     Tongue: No lesions.     Palate: No mass and lesions.     Pharynx: No pharyngeal swelling, oropharyngeal exudate, posterior oropharyngeal erythema or uvula swelling.     Tonsils: No tonsillar exudate or tonsillar abscesses.  Cardiovascular:     Rate and Rhythm: Normal rate and regular rhythm.     Heart sounds: Normal heart sounds.   Pulmonary:     Effort: Pulmonary effort is normal.     Breath sounds: Normal  breath sounds.  Neurological:     General: No focal deficit present.     Mental Status: She is alert and oriented to person, place, and time.  Psychiatric:        Mood and Affect: Mood normal.        Behavior: Behavior normal.        Thought Content: Thought content normal.        Judgment: Judgment normal.      UC Treatments / Results  Labs (all labs ordered are listed, but only abnormal results are displayed) Labs Reviewed - No data to display  EKG   Radiology No results found.  Procedures Procedures (including critical care time)  Medications Ordered in UC Medications - No data to display  Initial Impression / Assessment and Plan / UC Course  I have reviewed the triage vital signs and the nursing notes.  Pertinent labs & imaging results that were available during my care of the patient were reviewed by me and considered in my medical decision making (see chart for details).     Pt with penicillin allergy; clindamycin as below. rec sucking on sour candy. Return precautions- chest pain, shortness of breath, new/worsening fevers/chills, confusion, worsening of symptoms despite the above treatment plan, etc.    Final Clinical Impressions(s) / UC Diagnoses   Final diagnoses:  Parotitis     Discharge Instructions     For your infection, start Clindamycin. Take 3 pills (450mg ) three times daily.  Seek additional medical treatment if your infection worsens despite treatment; if you develops fevers/chills; if you have worsening nausea/diarrhea/etc.    ED Prescriptions    Medication Sig Dispense Auth. Provider   clindamycin (CLEOCIN) 150 MG capsule Take 3 capsules (450 mg total) by mouth 3 (three) times daily for 3 days. 28 capsule , PA-C     PDMP not reviewed this encounter.   Rhys Martini, PA-C 10/23/20 0825

## 2020-10-21 NOTE — ED Triage Notes (Signed)
Pt presents with pain behind her ear that radiates down her neck and causes head pain on her left side X 3 days.

## 2020-11-24 ENCOUNTER — Other Ambulatory Visit: Payer: Self-pay | Admitting: Physician Assistant

## 2020-11-24 DIAGNOSIS — E010 Iodine-deficiency related diffuse (endemic) goiter: Secondary | ICD-10-CM

## 2020-11-26 ENCOUNTER — Other Ambulatory Visit: Payer: Self-pay

## 2020-11-26 ENCOUNTER — Ambulatory Visit (HOSPITAL_COMMUNITY)
Admission: EM | Admit: 2020-11-26 | Discharge: 2020-11-26 | Disposition: A | Discharge status: Home / Self Care | Payer: Medicaid Other | Attending: Internal Medicine | Admitting: Internal Medicine

## 2020-11-26 ENCOUNTER — Encounter (HOSPITAL_COMMUNITY): Payer: Self-pay | Admitting: Emergency Medicine

## 2020-11-26 DIAGNOSIS — N898 Other specified noninflammatory disorders of vagina: Secondary | ICD-10-CM | POA: Diagnosis not present

## 2020-11-26 DIAGNOSIS — Z113 Encounter for screening for infections with a predominantly sexual mode of transmission: Secondary | ICD-10-CM | POA: Diagnosis not present

## 2020-11-26 DIAGNOSIS — Z7253 High risk bisexual behavior: Secondary | ICD-10-CM | POA: Insufficient documentation

## 2020-11-26 MED ORDER — LIDOCAINE HCL (PF) 1 % IJ SOLN
INTRAMUSCULAR | Status: AC
  Filled 2020-11-26: qty 2

## 2020-11-26 MED ORDER — CEFTRIAXONE SODIUM 500 MG IJ SOLR
500.0000 mg | Freq: Once | INTRAMUSCULAR | Status: AC
  Administered 2020-11-26: 500 mg via INTRAMUSCULAR

## 2020-11-26 MED ORDER — CEFTRIAXONE SODIUM 500 MG IJ SOLR
INTRAMUSCULAR | Status: AC
  Filled 2020-11-26: qty 500

## 2020-11-26 MED ORDER — METRONIDAZOLE 500 MG PO TABS
2000.0000 mg | ORAL_TABLET | Freq: Once | ORAL | Status: AC
  Administered 2020-11-26: 2000 mg via ORAL

## 2020-11-26 MED ORDER — DOXYCYCLINE HYCLATE 100 MG PO CAPS: 100.0000 mg | ORAL_CAPSULE | Freq: Two times a day (BID) | ORAL | 0 refills | Status: DC

## 2020-11-26 MED ORDER — AZITHROMYCIN 250 MG PO TABS
ORAL_TABLET | ORAL | Status: AC
  Filled 2020-11-26: qty 4

## 2020-11-26 MED ORDER — FLUCONAZOLE 150 MG PO TABS: 150.0000 mg | ORAL_TABLET | Freq: Every day | ORAL | 0 refills | Status: DC

## 2020-11-26 MED ORDER — METRONIDAZOLE 500 MG PO TABS
ORAL_TABLET | ORAL | Status: AC
  Filled 2020-11-26: qty 4

## 2020-11-26 MED ORDER — AZITHROMYCIN 250 MG PO TABS
1000.0000 mg | ORAL_TABLET | Freq: Once | ORAL | Status: AC
  Administered 2020-11-26: 1000 mg via ORAL

## 2020-11-26 NOTE — ED Provider Notes (Signed)
[facility]    CSN: [id number] Arrival date & time: 11/26/20  1657      History   Chief Complaint Chief Complaint  Patient presents with  . Vaginal Itching    HPI Mary Green is a 25 y.o. female who developed vaginal itching the day after having sex with a female. He was wearing a condom, but then she noticed the condom was off. She also took Clindamycin for oral infection and finished this one week ago. She wants treatment here since she wont have money til she gets paid in 2 weeks. Denies pelvic pain or vaginal lesions.      Past Medical History:  Diagnosis Date  . Anxiety 2009   previously on medications, seroquel, intuniv, risperdal, treated at [facility]   . Asthma     Patient Active Problem List   Diagnosis Date Noted  . Supervision of normal first pregnancy, antepartum 01/31/2018  . Amenorrhea 09/09/2014  . Panic attacks 09/09/2014  . Vaginal itching 09/09/2014    Past Surgical History:  Procedure Laterality Date  . NO PAST SURGERIES    . ROOT CANAL  2008     OB History    Gravida  1   Para  0   Term  0   Preterm  0   AB  0   Living  0     SAB  0   IAB  0   Ectopic  0   Multiple  0   Live Births  0            Home Medications    Prior to Admission medications   Medication Sig Start Date End Date Taking? Authorizing Provider  famotidine (PEPCID) 20 MG tablet Take 1 tablet (20 mg total) by mouth 2 (two) times daily. 08/17/20   Gailen Shelter, PA  hydrOXYzine (ATARAX/VISTARIL) 25 MG tablet Take 1 tablet (25 mg total) by mouth every 6 (six) hours. 08/17/20   Gailen Shelter, PA  ibuprofen (ADVIL,MOTRIN) 200 MG tablet Take 400 mg by mouth every 6 (six) hours as needed for headache or moderate pain.    [provider]  naproxen (NAPROSYN) 500 MG tablet Take 500 mg by mouth 2 (two) times daily. 11/13/20   [provider]  VALACYCLOVIR HCL PO Take by mouth.    [provider]  FLUoxetine (PROZAC) 20  MG tablet Take 1 tablet (20 mg total) by mouth daily. Patient not taking: Reported on 12/21/2019 09/09/14 12/21/19  Dessa Phi, MD  metoCLOPramide (REGLAN) 10 MG tablet Take 1 tablet (10 mg total) by mouth every 6 (six) hours as needed for nausea or vomiting. Patient not taking: Reported on 12/21/2019 01/31/18 12/21/19  Pincus Large, DO    Family History Family History  Problem Relation Age of Onset  . Sarcoidosis Mother   . Carpal tunnel syndrome Mother   . Asthma Sister   . Heart murmur Sister   . Cancer Neg Hx     Social History Social History   Tobacco Use  . Smoking status: Former Smoker    Packs/day: 0.25    Years: 2.00    Pack years: 0.50    Types: Cigarettes  . Smokeless tobacco: Never Used  Vaping Use  . Vaping Use: Never used  Substance Use Topics  . Alcohol use: Yes    Alcohol/week: 1.0 standard drink    Types: 1 Shots of liquor per week    Comment: twice monthly   . Drug  use: No     Allergies   Penicillins   Review of Systems Review of Systems  Genitourinary: Positive for vaginal discharge. Negative for dyspareunia, dysuria, vaginal bleeding and vaginal pain.       + vaginal itching  Skin: Negative for rash and wound.  Hematological: Negative for adenopathy.   Physical Exam Triage Vital Signs ED Triage Vitals  Enc Vitals Group     BP 11/26/20 1748 131/85     Pulse Rate 11/26/20 1748 (!) 101     Resp 11/26/20 1748 18     Temp 11/26/20 1748 98.3 F (36.8 C)     Temp Source 11/26/20 1748 Oral     SpO2 11/26/20 1748 100 %     Weight --      Height --      Head Circumference --      Peak Flow --      Pain Score 11/26/20 1745 3     Pain Loc --      Pain Edu? --      Excl. in GC? --    No data found.  Updated Vital Signs BP 131/85 (BP Location: Left Arm)   Pulse (!) 101   Temp 98.3 F (36.8 C) (Oral)   Resp 18   LMP 11/12/2020   SpO2 100%   Visual Acuity Right Eye Distance:   Left Eye Distance:   Bilateral Distance:    Right  Eye Near:   Left Eye Near:    Bilateral Near:     Physical Exam Vitals and nursing note reviewed.  Constitutional:      General: She is not in acute distress.    Appearance: She is normal weight. She is not toxic-appearing.  HENT:     Head: Normocephalic.     Right Ear: External ear normal.     Left Ear: External ear normal.  Eyes:     General: No scleral icterus.    Conjunctiva/sclera: Conjunctivae normal.  Pulmonary:     Effort: Pulmonary effort is normal.  Musculoskeletal:     Cervical back: Neck supple.  Skin:    General: Skin is warm and dry.  Neurological:     Mental Status: She is alert and oriented to person, place, and time.     Gait: Gait normal.  Psychiatric:        Mood and Affect: Mood normal.        Behavior: Behavior normal.        Thought Content: Thought content normal.        Judgment: Judgment normal.    UC Treatments / Results  Labs (all labs ordered are listed, but only abnormal results are displayed) Labs Reviewed  CERVICOVAGINAL ANCILLARY ONLY    EKG   Radiology No results found.  Procedures Procedures (including critical care time)  Medications Ordered in UC Medications - No data to display  Initial Impression / Assessment and Plan / UC Course  I have reviewed the triage vital signs and the nursing notes. STD test is pending. She was given Rocephin 500 mg IM, Flagyl 2 G PO. Zithromax 1000 mg PO. I sent Diflucan and Doxy to her pharmacy to get when she gest paid and to take the Doxy if her chlamydia is positive.   Final Clinical Impressions(s) / UC Diagnoses   Final diagnoses:  None   Discharge Instructions   None    ED Prescriptions    None     PDMP not reviewed this encounter.  Garey Ham, [place] 11/26/20 1912

## 2020-11-26 NOTE — Discharge Instructions (Signed)
We dont have yeast infection medication, so I sent it for you. The doxycycline is the best medication for Chlamydia, where before Azithromycin used to cover it ,but we have found out it does not. So since you dont have the finances, we gave you a dose here. The metronidazol( 4 pills) will cover Trichimonas which like yeast causes itching.  I also sent the Doxycycline if your chlamydia test ends up being positive. Pick it up when you get paid.

## 2020-11-26 NOTE — ED Triage Notes (Signed)
Pt presents with vaginal itching and discharge xs 2 days.

## 2020-11-27 LAB — CERVICOVAGINAL ANCILLARY ONLY
Bacterial Vaginitis (gardnerella): NEGATIVE
Candida Glabrata: NEGATIVE
Candida Vaginitis: POSITIVE — AB
Chlamydia: NEGATIVE
Comment: NEGATIVE
Comment: NEGATIVE
Comment: NEGATIVE
Comment: NEGATIVE
Comment: NEGATIVE
Comment: NORMAL
Neisseria Gonorrhea: NEGATIVE
Trichomonas: NEGATIVE

## 2020-12-03 ENCOUNTER — Ambulatory Visit
Admission: RE | Admit: 2020-12-03 | Discharge: 2020-12-03 | Disposition: A | Discharge status: Home / Self Care | Payer: Medicaid Other | Source: Ambulatory Visit | Attending: Physician Assistant | Admitting: Physician Assistant

## 2020-12-03 DIAGNOSIS — E010 Iodine-deficiency related diffuse (endemic) goiter: Secondary | ICD-10-CM

## 2021-01-17 ENCOUNTER — Encounter: Payer: Self-pay | Admitting: Physician Assistant

## 2021-01-17 ENCOUNTER — Telehealth: Payer: Medicaid Other | Admitting: Physician Assistant

## 2021-01-17 DIAGNOSIS — M542 Cervicalgia: Secondary | ICD-10-CM | POA: Diagnosis not present

## 2021-01-17 MED ORDER — METHOCARBAMOL 500 MG PO TABS
500.0000 mg | ORAL_TABLET | Freq: Every evening | ORAL | 0 refills | Status: DC | PRN
Start: 2021-01-17 — End: 2021-01-27

## 2021-01-17 MED ORDER — NAPROXEN 500 MG PO TABS: 500.0000 mg | ORAL_TABLET | Freq: Two times a day (BID) | ORAL | 0 refills | Status: DC

## 2021-01-17 NOTE — Progress Notes (Addendum)
Virtual Visit via Video Note  I connected with Mary Green on 01/17/21 at  3:15 PM EDT by a video enabled telemedicine application and verified that I am speaking with the correct person using two identifiers.  Location: Patient: home Provider: provider's office Person participating in the virtual visit: patient and provider    I discussed the limitations of evaluation and management by telemedicine and the availability of in person appointments. The patient expressed understanding and agreed to proceed.     I discussed the assessment and treatment plan with the patient. The patient was provided an opportunity to ask questions and all were answered. The patient agreed with the plan and demonstrated an understanding of the instructions.   The patient was advised to call back or seek an in-person evaluation if the symptoms worsen or if the condition fails to improve as anticipated.    Demetrio LappingSahar M Lam Mccubbins, PA-C     Mary Green,you are scheduled for a virtual visit with your provider today.    Just as we do with appointments in the office, we must obtain your consent to participate.  Your consent will be active for this visit and any virtual visit you may have with one of our providers in the next 365 days.    If you have a MyChart account, I can also send a copy of this consent to you electronically.  All virtual visits are billed to your insurance company just like a traditional visit in the office.  As this is a virtual visit, video technology does not allow for your provider to perform a traditional examination.  This may limit your provider's ability to fully assess your condition.  If your provider identifies any concerns that need to be evaluated in person or the need to arrange testing such as labs, EKG, etc, we will make arrangements to do so.    Although advances in technology are sophisticated, we cannot ensure that it will always work on either your end or our end.  If the  connection with a video visit is poor, we may have to switch to a telephone visit.  With either a video or telephone visit, we are not always able to ensure that we have a secure connection.   I need to obtain your verbal consent now.   Are you willing to proceed with your visit today?   Mary Green has provided verbal consent on 01/17/2021 for a virtual visit :video    Demetrio LappingSahar M Nhia Heaphy, Cordelia Poche-C 01/17/2021  3:15 PM    Acute Office Visit  Subjective:    Patient ID: Mary Green, female    DOB: 03/08/1996, 25 y.o.   MRN: [id number]  No chief complaint on file.   25 yo F in NAD presents with L sided neck pain x 2.5 weeks. The pain is mild, described as a "an ache", worse with left sided neck rotation. States sleeps on the left side of her body with her arm behind her neck- unsure if that was what caused the pain. Has on and off headache that she has.  Denies any direct trauma or injury. Has been taking Naproxen on and off for it which helps but ran out of the Naproxen. 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 Denies any fever, chills, cp, dyspnea, abd pain, sore throat, cough, n, v, hearing/visual changes, dental pain, ear pain, redness/swelling/skin warmth of the face/neck, rash on neck or body, neck "stiffness". Denies any  exposure to strep/mono or other illness.  Neck Pain  The problem occurs intermittently. The pain is associated with a sleep position. The pain is present in the left side. The quality of the pain is described as aching. The pain is mild. Nothing aggravates the symptoms. Associated symptoms include headaches. Pertinent negatives include no chest pain, fever, leg pain, numbness, pain with swallowing, paresis, visual change or weakness. She has tried NSAIDs for the symptoms. The treatment provided moderate relief.   Patient is in today for left sided neck pain  Past Medical History:  Diagnosis Date  . Anxiety 2009   previously on  medications, seroquel, intuniv, risperdal, treated at [facility]   . Asthma     Past Surgical History:  Procedure Laterality Date  . NO PAST SURGERIES    . ROOT CANAL  2008     Family History  Problem Relation Age of Onset  . Sarcoidosis Mother   . Carpal tunnel syndrome Mother   . Asthma Sister   . Heart murmur Sister   . Cancer Neg Hx     Social History   Socioeconomic History  . Marital status: Legally Separated    Spouse name: Not on file  . Number of children: 0  . Years of education: 101   . Highest education level: Not on file  Occupational History  . Occupation: Consulting civil engineer    Comment: Part time   . Occupation: Occupational hygienist     CommentChief Technology Officer   Tobacco Use  . Smoking status: Former Smoker    Packs/day: 0.25    Years: 2.00    Pack years: 0.50    Types: Cigarettes  . Smokeless tobacco: Never Used  Vaping Use  . Vaping Use: Never used  Substance and Sexual Activity  . Alcohol use: Yes    Alcohol/week: 1.0 standard drink    Types: 1 Shots of liquor per week    Comment: twice monthly   . Drug use: No  . Sexual activity: Not Currently    Birth control/protection: Condom, None  Other Topics Concern  . Not on file  Social History Narrative   Lives alone.    Mom lives in [place].    Social Determinants of Health   Financial Resource Strain: Not on file  Food Insecurity: Not on file  Transportation Needs: Not on file  Physical Activity: Not on file  Stress: Not on file  Social Connections: Not on file  Intimate Partner Violence: Not on file    Outpatient Medications Prior to Visit  Medication Sig Dispense Refill  . doxycycline (VIBRAMYCIN) 100 MG capsule Take 1 capsule (100 mg total) by mouth 2 (two) times daily. 20 capsule 0  . famotidine (PEPCID) 20 MG tablet Take 1 tablet (20 mg total) by mouth 2 (two) times daily. 30 tablet 0  . fluconazole (DIFLUCAN) 150 MG tablet Take 1 tablet (150 mg total) by mouth daily. 2  tablet 0  . hydrOXYzine (ATARAX/VISTARIL) 25 MG tablet Take 1 tablet (25 mg total) by mouth every 6 (six) hours. 24 tablet 0  . VALACYCLOVIR HCL PO Take by mouth.    Marland Kitchen ibuprofen (ADVIL,MOTRIN) 200 MG tablet Take 400 mg by mouth every 6 (six) hours as needed for headache or moderate pain. (Patient not taking: Reported on 01/17/2021)    . naproxen (NAPROSYN) 500 MG tablet Take 500 mg by mouth 2 (two) times daily. (Patient not taking: No sig reported)     No facility-administered medications prior to  visit.    Allergies  Allergen Reactions  . Penicillins Other (See Comments)    Childhood reaction    Review of Systems  Constitutional: Negative for chills and fever.  HENT: Negative for dental problem and facial swelling.   Respiratory: Negative for shortness of breath.   Cardiovascular: Negative for chest pain.  Gastrointestinal: Negative for abdominal pain, nausea and vomiting.  Musculoskeletal: Positive for neck pain. Negative for arthralgias, back pain, gait problem, joint swelling, myalgias and neck stiffness.  Skin: Negative for color change, pallor, rash and wound.  Neurological: Positive for headaches. Negative for dizziness, weakness and numbness.  Hematological: Negative for adenopathy.  Psychiatric/Behavioral: Negative for confusion.       Objective:    Physical Exam Constitutional:      General: She is not in acute distress.    Appearance: Normal appearance. She is not ill-appearing, toxic-appearing or diaphoretic.  HENT:     Head: Normocephalic and atraumatic.  Eyes:     General: No scleral icterus. Pulmonary:     Effort: Pulmonary effort is normal.  Musculoskeletal:     Cervical back: Normal range of motion and neck supple. Tenderness present. No rigidity.  Skin:    Coloration: Skin is not pale.     Findings: No rash.  Neurological:     Mental Status: She is alert and oriented to person, place, and time.     Cranial Nerves: No facial asymmetry.  Psychiatric:         Mood and Affect: Mood normal.        Behavior: Behavior normal.        Thought Content: Thought content normal.        Judgment: Judgment normal.     There were no vitals taken for this visit. Wt Readings from Last 3 Encounters:  08/16/20 180 lb 12.4 oz (82 kg)  07/29/20 176 lb 5.9 oz (80 kg)  07/22/20 164 lb (74.4 kg)    Health Maintenance Due  Topic Date Due  . Hepatitis C Screening  Never done  . COVID-19 Vaccine (1) Never done  . HPV VACCINES (1 - 2-dose series) Never done  . TETANUS/TDAP  Never done  . PAP-Cervical Cytology Screening  Never done  . PAP SMEAR-Modifier  Never done  . INFLUENZA VACCINE  Never done       Topic Date Due  . HPV VACCINES (1 - 2-dose series) Never done     Lab Results  Component Value Date   TSH 3.597 09/03/2020   Lab Results  Component Value Date   WBC 5.2 09/03/2020   HGB 15.4 (H) 09/03/2020   HCT 46.3 (H) 09/03/2020   MCV 89.4 09/03/2020   PLT 435 (H) 09/03/2020   Lab Results  Component Value Date   NA 138 09/03/2020   K 4.0 09/03/2020   CO2 26 09/03/2020   GLUCOSE 94 09/03/2020   BUN 6 09/03/2020   CREATININE 0.84 09/03/2020   BILITOT 0.8 09/03/2020   ALKPHOS 60 09/03/2020   AST 18 09/03/2020   ALT 13 09/03/2020   PROT 7.8 09/03/2020   ALBUMIN 4.5 09/03/2020   CALCIUM 10.0 09/03/2020   ANIONGAP 10 09/03/2020   No results found for: CHOL No results found for: HDL No results found for: LDLCALC No results found for: TRIG No results found for: CHOLHDL No results found for: ZOXW9U     Assessment & Plan:   Problem List Items Addressed This Visit   None   Visit Diagnoses  Neck pain on left side    -  Primary   Relevant Medications   naproxen (NAPROSYN) 500 MG tablet   methocarbamol (ROBAXIN) 500 MG tablet       Meds ordered this encounter  Medications  . naproxen (NAPROSYN) 500 MG tablet    Sig: Take 1 tablet (500 mg total) by mouth 2 (two) times daily with a meal.    Dispense:  20 tablet     Refill:  0    Order Specific Question:   Supervising Provider    Answer:   MILLER, BRIAN [[id number]]  . methocarbamol (ROBAXIN) 500 MG tablet    Sig: Take 1 tablet (500 mg total) by mouth at bedtime as needed for muscle spasms.    Dispense:  15 tablet    Refill:  0    Order Specific Question:   Supervising Provider    Answer:   Eber Hong [[id number]]    Discussed pain likely musculoskeletal in nature due to way she sleeps. Mild symptoms, no other red flags- rash, persistent headache, neck stiffness. Advised if any changes, submit a video visit, follow up with her doctor, go to an urgent care or go to the ER. Voiced understanding. Demetrio Lapping, PA-C

## 2021-01-27 ENCOUNTER — Other Ambulatory Visit: Payer: Self-pay

## 2021-01-27 ENCOUNTER — Encounter (HOSPITAL_COMMUNITY): Payer: Self-pay | Admitting: Emergency Medicine

## 2021-01-27 ENCOUNTER — Ambulatory Visit (HOSPITAL_COMMUNITY)
Admission: EM | Admit: 2021-01-27 | Discharge: 2021-01-27 | Disposition: A | Discharge status: Home / Self Care | Payer: Medicaid Other | Attending: Student | Admitting: Student

## 2021-01-27 DIAGNOSIS — Z760 Encounter for issue of repeat prescription: Secondary | ICD-10-CM | POA: Diagnosis not present

## 2021-01-27 DIAGNOSIS — B001 Herpesviral vesicular dermatitis: Secondary | ICD-10-CM | POA: Diagnosis not present

## 2021-01-27 DIAGNOSIS — J4520 Mild intermittent asthma, uncomplicated: Secondary | ICD-10-CM

## 2021-01-27 DIAGNOSIS — M7918 Myalgia, other site: Secondary | ICD-10-CM

## 2021-01-27 MED ORDER — NAPROXEN 500 MG PO TABS: 500.0000 mg | ORAL_TABLET | Freq: Two times a day (BID) | ORAL | 0 refills | Status: AC

## 2021-01-27 MED ORDER — VALACYCLOVIR HCL 500 MG PO TABS: 500.0000 mg | ORAL_TABLET | Freq: Two times a day (BID) | ORAL | 2 refills | Status: AC

## 2021-01-27 MED ORDER — ALBUTEROL SULFATE HFA 108 (90 BASE) MCG/ACT IN AERS: 1.0000 | INHALATION_SPRAY | Freq: Four times a day (QID) | RESPIRATORY_TRACT | 0 refills | Status: AC | PRN

## 2021-01-27 NOTE — Discharge Instructions (Addendum)
-  Continue the albuterol as needed for cough and shortness of breath. -Naproxen as needed for musculoskeletal pain, up to 2 pills daily taken with food.  Avoid other NSAIDs while on this medication like ibuprofen and Aleve. -Valtrex as needed for cold sores.  Take 2 pills a day for 3 days.  I provided 2 refills. -Follow-up as scheduled with your new primary care provider. -Seek additional immediate medical attention if your symptoms worsen/persist despite treatment.

## 2021-01-27 NOTE — ED Triage Notes (Signed)
Prior pcp and patient went their separate ways.  Needs valcyclovir, Albuterol, and naproxen.

## 2021-01-27 NOTE — ED Provider Notes (Signed)
[facility]    CSN: [id number] Arrival date & time: 01/27/21  1213      History   Chief Complaint Chief Complaint  Patient presents with  . Medication Refill    HPI Mary Green is a 25 y.o. female presenting for medication refill.  History anxiety, asthma, panic attacks.  Will establish care with a primary care in 2 months but states she needs refills until then. -States she gets very occasional cold sore outbreaks and has been on Valtrex for this in the past.  Requesting refill of this so she has it on hand in case she has an outbreak.  No cold sores/ genital lesions currently. -Also requesting albuterol refill for her mild intermittent asthma.  No issues with this today. -Requesting naproxen refill which she uses for occasional musculoskeletal pain.  No pain today.  HPI  Past Medical History:  Diagnosis Date  . Anxiety 2009   previously on medications, seroquel, intuniv, risperdal, treated at [facility]   . Asthma     Patient Active Problem List   Diagnosis Date Noted  . Supervision of normal first pregnancy, antepartum 01/31/2018  . Amenorrhea 09/09/2014  . Panic attacks 09/09/2014  . Vaginal itching 09/09/2014    Past Surgical History:  Procedure Laterality Date  . NO PAST SURGERIES    . ROOT CANAL  2008     OB History    Gravida  1   Para  0   Term  0   Preterm  0   AB  0   Living  0     SAB  0   IAB  0   Ectopic  0   Multiple  0   Live Births  0            Home Medications    Prior to Admission medications   Medication Sig Start Date End Date Taking? Authorizing Provider  albuterol (VENTOLIN HFA) 108 (90 Base) MCG/ACT inhaler Inhale 1-2 puffs into the lungs every 6 (six) hours as needed for wheezing or shortness of breath. 01/27/21  Yes Rhys Martini, PA-C  naproxen (NAPROSYN) 500 MG tablet Take 1 tablet (500 mg total) by mouth 2 (two) times daily. 01/27/21  Yes Rhys Martini, PA-C  valACYclovir (VALTREX) 500 MG  tablet Take 1 tablet (500 mg total) by mouth 2 (two) times daily for 3 days. 01/27/21 01/30/21 Yes Rhys Martini, PA-C  famotidine (PEPCID) 20 MG tablet Take 1 tablet (20 mg total) by mouth 2 (two) times daily. 08/17/20 01/27/21  Gailen Shelter, PA  FLUoxetine (PROZAC) 20 MG tablet Take 1 tablet (20 mg total) by mouth daily. Patient not taking: Reported on 12/21/2019 09/09/14 12/21/19  Dessa Phi, MD  metoCLOPramide (REGLAN) 10 MG tablet Take 1 tablet (10 mg total) by mouth every 6 (six) hours as needed for nausea or vomiting. Patient not taking: Reported on 12/21/2019 01/31/18 12/21/19  Pincus Large, DO    Family History Family History  Problem Relation Age of Onset  . Sarcoidosis Mother   . Carpal tunnel syndrome Mother   . Asthma Sister   . Heart murmur Sister   . Cancer Neg Hx     Social History Social History   Tobacco Use  . Smoking status: Former Smoker    Packs/day: 0.25    Years: 2.00    Pack years: 0.50    Types: Cigarettes  . Smokeless tobacco: Never Used  Vaping Use  . Vaping Use: Never  used  Substance Use Topics  . Alcohol use: Yes    Alcohol/week: 1.0 standard drink    Types: 1 Shots of liquor per week    Comment: twice monthly   . Drug use: No     Allergies   Penicillins   Review of Systems Review of Systems  All other systems reviewed and are negative.    Physical Exam Triage Vital Signs ED Triage Vitals  Enc Vitals Group     BP 01/27/21 1236 114/76     Pulse Rate 01/27/21 1236 90     Resp 01/27/21 1236 18     Temp 01/27/21 1236 97.8 F (36.6 C)     Temp Source 01/27/21 1236 Oral     SpO2 01/27/21 1236 99 %     Weight --      Height --      Head Circumference --      Peak Flow --      Pain Score 01/27/21 1233 6     Pain Loc --      Pain Edu? --      Excl. in GC? --    No data found.  Updated Vital Signs BP 114/76 (BP Location: Left Arm)   Pulse 90   Temp 97.8 F (36.6 C) (Oral)   Resp 18   LMP 12/29/2020   SpO2 99%    Visual Acuity Right Eye Distance:   Left Eye Distance:   Bilateral Distance:    Right Eye Near:   Left Eye Near:    Bilateral Near:     Physical Exam Vitals reviewed.  Constitutional:      Appearance: Normal appearance.  HENT:     Head: Normocephalic and atraumatic.  Pulmonary:     Effort: Pulmonary effort is normal.  Neurological:     General: No focal deficit present.     Mental Status: She is alert and oriented to person, place, and time.  Psychiatric:        Mood and Affect: Mood normal.        Behavior: Behavior normal.        Thought Content: Thought content normal.        Judgment: Judgment normal.      UC Treatments / Results  Labs (all labs ordered are listed, but only abnormal results are displayed) Labs Reviewed - No data to display  EKG   Radiology No results found.  Procedures Procedures (including critical care time)  Medications Ordered in UC Medications - No data to display  Initial Impression / Assessment and Plan / UC Course  I have reviewed the triage vital signs and the nursing notes.  Pertinent labs & imaging results that were available during my care of the patient were reviewed by me and considered in my medical decision making (see chart for details).     This patient is a 25 year old female presenting for medication refills. No issues today. Valtrex, albuterol, naproxen refilled as below.  Establish care with primary care as scheduled.  Return precautions discussed.  This chart was dictated using voice recognition software, Dragon. Despite the best efforts of this provider to proofread and correct errors, errors may still occur which can change documentation meaning.   Final Clinical Impressions(s) / UC Diagnoses   Final diagnoses:  Medication refill  Mild intermittent asthma without complication  Musculoskeletal pain  Recurrent cold sores     Discharge Instructions     -Continue the albuterol as needed for cough  and  shortness of breath. -Naproxen as needed for musculoskeletal pain, up to 2 pills daily taken with food.  Avoid other NSAIDs while on this medication like ibuprofen and Aleve. -Valtrex as needed for cold sores.  Take 2 pills a day for 3 days.  I provided 2 refills. -Follow-up as scheduled with your new primary care provider. -Seek additional immediate medical attention if your symptoms worsen/persist despite treatment.   ED Prescriptions    Medication Sig Dispense Auth. Provider   valACYclovir (VALTREX) 500 MG tablet Take 1 tablet (500 mg total) by mouth 2 (two) times daily for 3 days. 6 tablet Rhys Martini, PA-C   naproxen (NAPROSYN) 500 MG tablet Take 1 tablet (500 mg total) by mouth 2 (two) times daily. 30 tablet Rhys Martini, PA-C   albuterol (VENTOLIN HFA) 108 (90 Base) MCG/ACT inhaler Inhale 1-2 puffs into the lungs every 6 (six) hours as needed for wheezing or shortness of breath. 1 each Rhys Martini, PA-C     PDMP not reviewed this encounter.   Rhys Martini, PA-C 01/27/21 1325

## 2022-08-22 IMAGING — US US THYROID
1 series · 13 of 25 positions shown · non-contrast
Comparison: None.

CLINICAL DATA: Palpable abnormality.

EXAM:
THYROID ULTRASOUND
TECHNIQUE: Ultrasound examination of the thyroid gland and adjacent soft
tissues was performed.

[Series 1: us thyroid · 0.08mm/px · 13 of 40 slices shown]
[im 1/40]
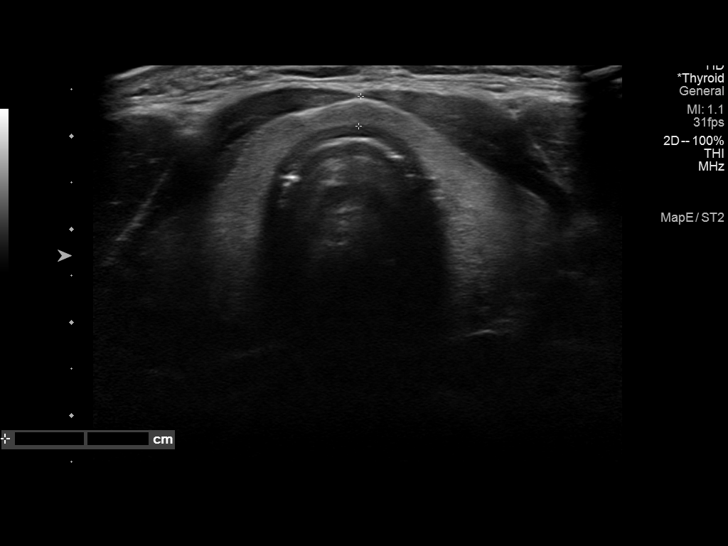
[im 4/40]
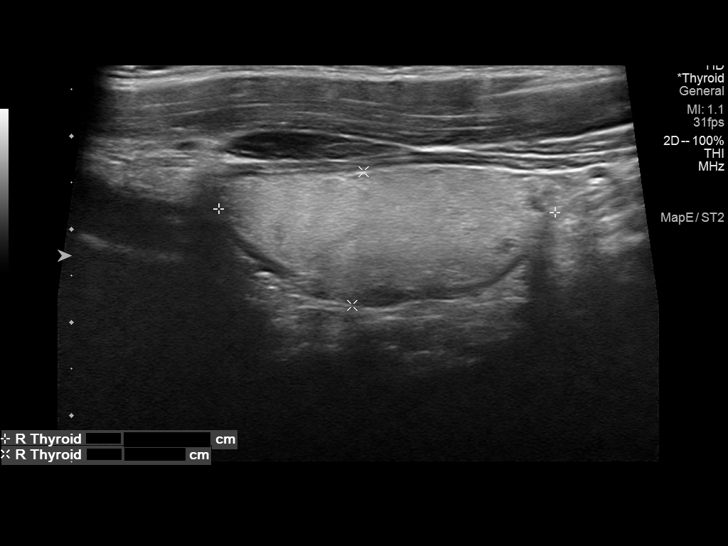
[im 7/40]
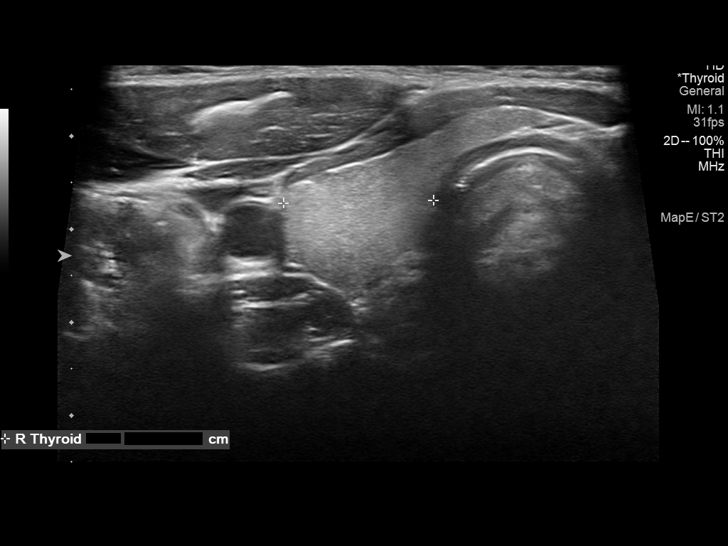
[im 10/40]
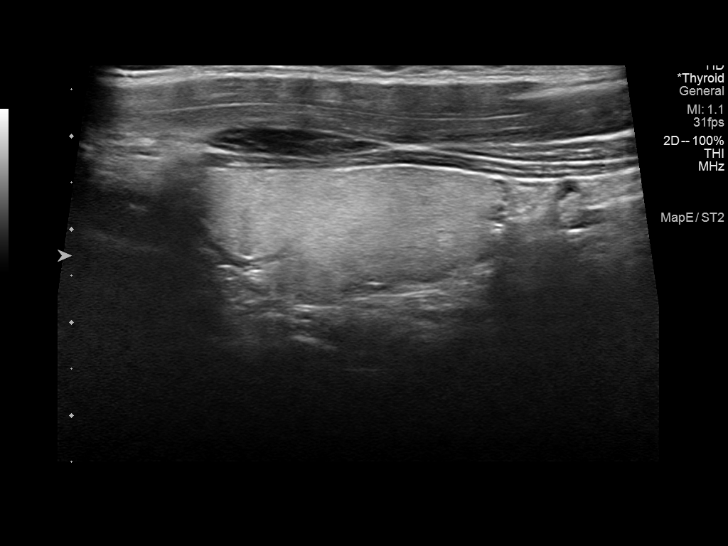
[im 14/40]
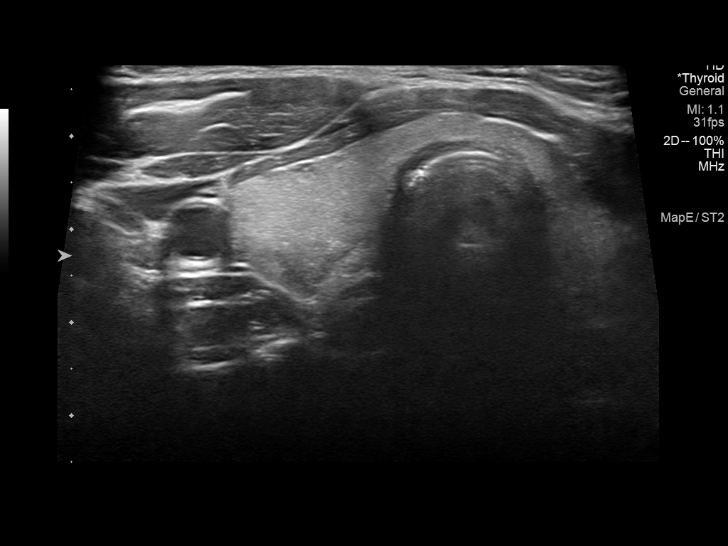
[im 17/40]
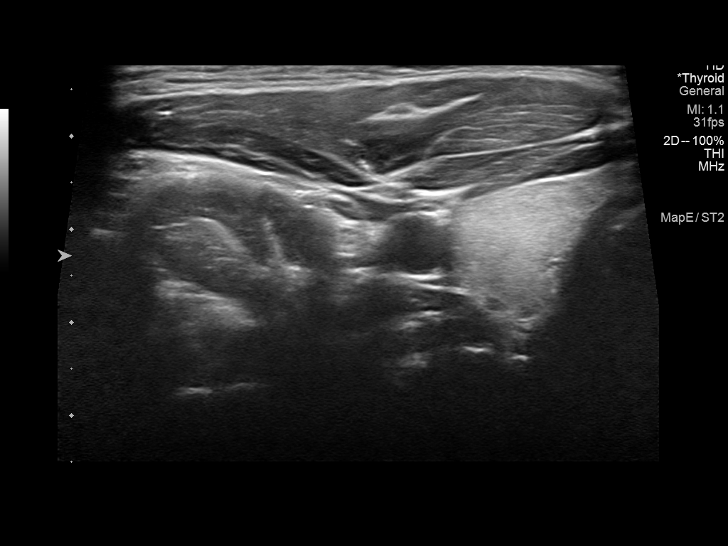
[im 20/40]
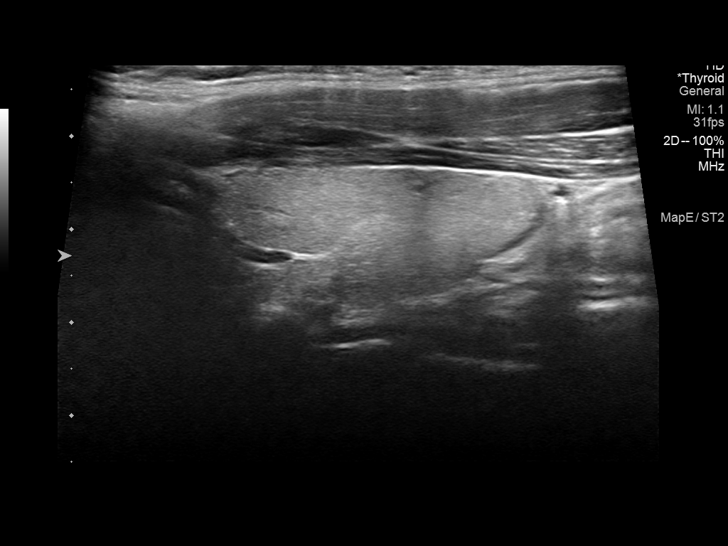
[im 23/40]
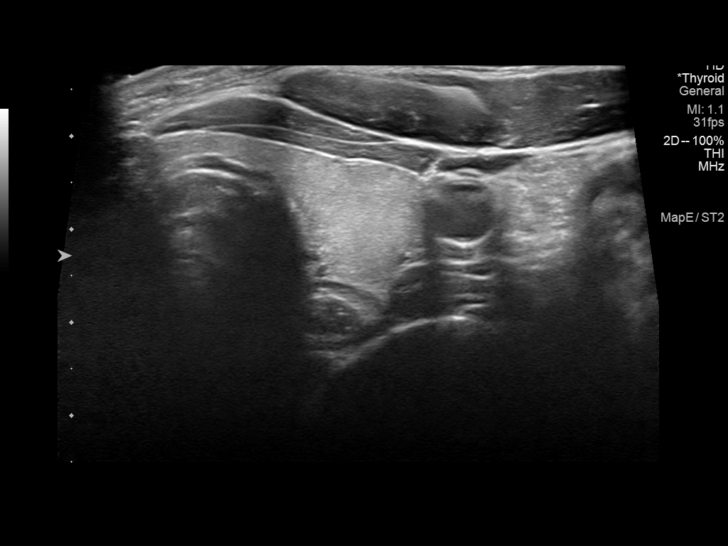
[im 27/40]
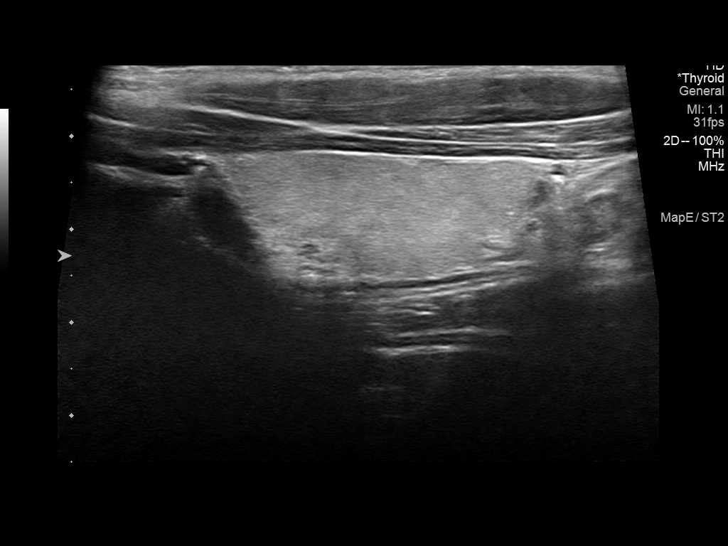
[im 30/40]
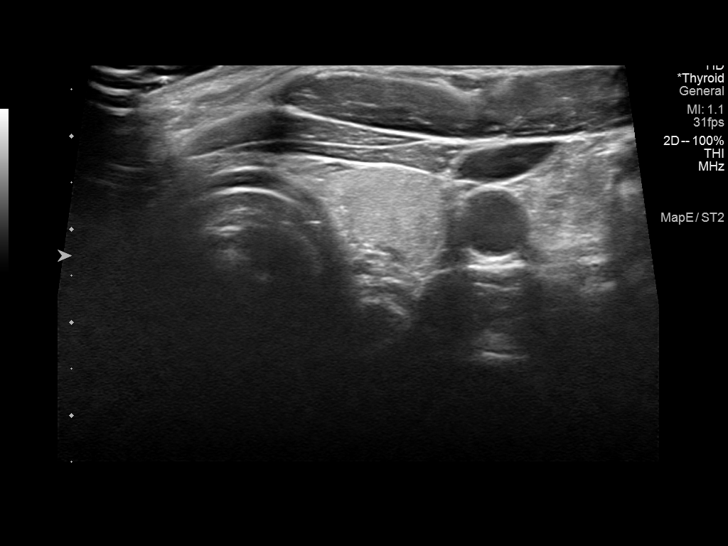
[im 33/40]
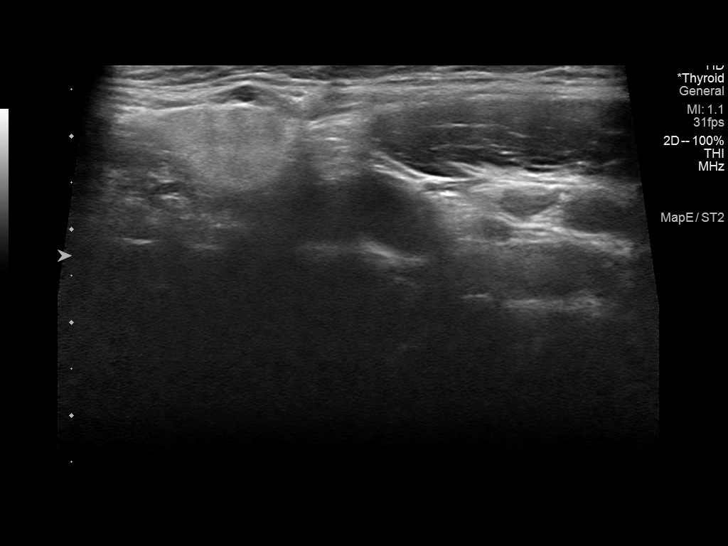
[im 36/40]
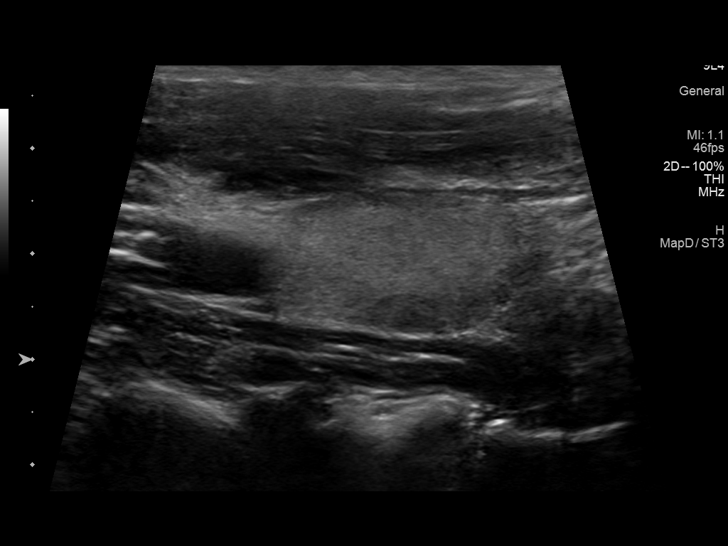
[im 40/40]
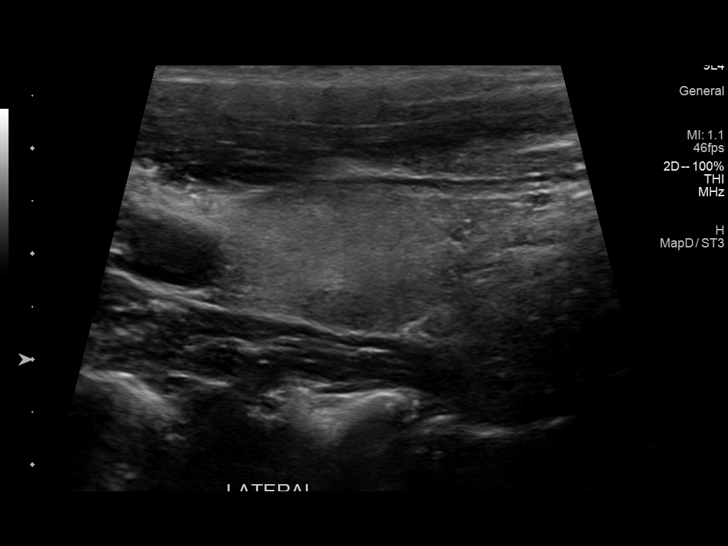

[13 of 25 positions shown; findings below may reference images not displayed]

FINDINGS: Parenchymal Echotexture: Normal

Isthmus: 0.3 cm

Right lobe: 3.6 x 1.4 x 1.6 cm

Left lobe: 3.8 x 1.6 x 1.5 cm

_________________________________________________________

Estimated total number of nodules >/= 1 cm: 0

Number of spongiform nodules >/=  2 cm not described below (TR1): 0

Number of mixed cystic and solid nodules >/= 1.5 cm not described
below (TR2): 0

_________________________________________________________

Nodule # 1:

Location: Right; Mid

Maximum size: 0.7 cm; Other 2 dimensions: 0.4 x 0.3 cm

Composition: solid/almost completely solid (2)

Echogenicity: hypoechoic (2)

Shape: not taller-than-wide (0)

Margins: smooth (0)

Echogenic foci: none (0)

ACR TI-RADS total points: 4.

ACR TI-RADS risk category: TR4 (4-6 points).

ACR TI-RADS recommendations:

Given size (<0.9 cm) and appearance, this nodule does NOT meet
TI-RADS criteria for biopsy or dedicated follow-up.

_________________________________________________________
IMPRESSION: Benign-appearing nodule versus normal parathyroid gland in the
posterior aspect of the right mid thyroid. No dedicated ultrasound
follow-up or tissue sampling is recommended.

The remaining thyroid gland is otherwise within normal limits.

The above is in keeping with the ACR TI-RADS recommendations - [HOSPITAL] 0357;[DATE].
